# Patient Record
Sex: Male | Born: 1960 | Race: White | Hispanic: No | Smoking: Current every day smoker
Health system: Southern US, Community
[De-identification: ages and names within clinical notes are randomized; demographics above are authoritative.]

## PROBLEM LIST (undated history)

## (undated) DIAGNOSIS — C189 Malignant neoplasm of colon, unspecified: Secondary | ICD-10-CM

## (undated) DIAGNOSIS — F419 Anxiety disorder, unspecified: Secondary | ICD-10-CM

## (undated) DIAGNOSIS — G709 Myoneural disorder, unspecified: Secondary | ICD-10-CM

## (undated) DIAGNOSIS — T7840XA Allergy, unspecified, initial encounter: Secondary | ICD-10-CM

## (undated) DIAGNOSIS — F32A Depression, unspecified: Secondary | ICD-10-CM

## (undated) DIAGNOSIS — F329 Major depressive disorder, single episode, unspecified: Secondary | ICD-10-CM

## (undated) HISTORY — DX: Anxiety disorder, unspecified: F41.9

## (undated) HISTORY — DX: Allergy, unspecified, initial encounter: T78.40XA

## (undated) HISTORY — DX: Major depressive disorder, single episode, unspecified: F32.9

## (undated) HISTORY — DX: Malignant neoplasm of colon, unspecified: C18.9

## (undated) HISTORY — DX: Myoneural disorder, unspecified: G70.9

## (undated) HISTORY — PX: COLON RESECTION: SHX5231

## (undated) HISTORY — DX: Depression, unspecified: F32.A

---

## 2014-10-27 DIAGNOSIS — K6389 Other specified diseases of intestine: Secondary | ICD-10-CM | POA: Insufficient documentation

## 2014-10-27 DIAGNOSIS — L02211 Cutaneous abscess of abdominal wall: Secondary | ICD-10-CM | POA: Insufficient documentation

## 2014-10-27 DIAGNOSIS — A419 Sepsis, unspecified organism: Secondary | ICD-10-CM | POA: Insufficient documentation

## 2014-10-30 DIAGNOSIS — G9341 Metabolic encephalopathy: Secondary | ICD-10-CM | POA: Insufficient documentation

## 2014-11-05 DIAGNOSIS — E43 Unspecified severe protein-calorie malnutrition: Secondary | ICD-10-CM | POA: Insufficient documentation

## 2014-11-12 DIAGNOSIS — F10231 Alcohol dependence with withdrawal delirium: Secondary | ICD-10-CM | POA: Insufficient documentation

## 2014-11-12 DIAGNOSIS — F10931 Alcohol use, unspecified with withdrawal delirium: Secondary | ICD-10-CM | POA: Insufficient documentation

## 2014-12-09 DIAGNOSIS — C182 Malignant neoplasm of ascending colon: Secondary | ICD-10-CM | POA: Insufficient documentation

## 2015-02-27 DIAGNOSIS — R21 Rash and other nonspecific skin eruption: Secondary | ICD-10-CM | POA: Insufficient documentation

## 2015-02-27 DIAGNOSIS — R112 Nausea with vomiting, unspecified: Secondary | ICD-10-CM | POA: Insufficient documentation

## 2015-02-27 DIAGNOSIS — T451X5A Adverse effect of antineoplastic and immunosuppressive drugs, initial encounter: Secondary | ICD-10-CM

## 2015-02-27 DIAGNOSIS — T7840XA Allergy, unspecified, initial encounter: Secondary | ICD-10-CM | POA: Insufficient documentation

## 2015-02-27 DIAGNOSIS — R6889 Other general symptoms and signs: Secondary | ICD-10-CM | POA: Insufficient documentation

## 2015-02-27 DIAGNOSIS — R197 Diarrhea, unspecified: Secondary | ICD-10-CM | POA: Insufficient documentation

## 2015-04-07 DIAGNOSIS — L0211 Cutaneous abscess of neck: Secondary | ICD-10-CM | POA: Insufficient documentation

## 2015-04-07 DIAGNOSIS — W57XXXA Bitten or stung by nonvenomous insect and other nonvenomous arthropods, initial encounter: Secondary | ICD-10-CM | POA: Insufficient documentation

## 2015-04-07 DIAGNOSIS — R21 Rash and other nonspecific skin eruption: Secondary | ICD-10-CM | POA: Insufficient documentation

## 2015-04-07 DIAGNOSIS — E876 Hypokalemia: Secondary | ICD-10-CM | POA: Insufficient documentation

## 2015-04-28 DIAGNOSIS — J9809 Other diseases of bronchus, not elsewhere classified: Secondary | ICD-10-CM | POA: Insufficient documentation

## 2015-04-28 DIAGNOSIS — L309 Dermatitis, unspecified: Secondary | ICD-10-CM | POA: Insufficient documentation

## 2015-04-28 DIAGNOSIS — T7840XA Allergy, unspecified, initial encounter: Secondary | ICD-10-CM | POA: Insufficient documentation

## 2015-07-07 DIAGNOSIS — IMO0002 Reserved for concepts with insufficient information to code with codable children: Secondary | ICD-10-CM | POA: Insufficient documentation

## 2016-05-30 ENCOUNTER — Encounter (HOSPITAL_COMMUNITY): Payer: BLUE CROSS/BLUE SHIELD

## 2016-05-30 ENCOUNTER — Ambulatory Visit (HOSPITAL_COMMUNITY)
Admission: RE | Admit: 2016-05-30 | Discharge: 2016-05-30 | Disposition: A | Discharge status: Home / Self Care | Payer: BLUE CROSS/BLUE SHIELD | Source: Ambulatory Visit | Attending: Oncology | Admitting: Oncology

## 2016-05-30 ENCOUNTER — Encounter (HOSPITAL_COMMUNITY): Payer: Self-pay

## 2016-05-30 ENCOUNTER — Encounter (HOSPITAL_COMMUNITY): Payer: BLUE CROSS/BLUE SHIELD | Attending: Oncology | Admitting: Oncology

## 2016-05-30 VITALS — BP 115/75 | HR 64 | Temp 99.1°F | Resp 18 | Ht 71.75 in | Wt 176.7 lb

## 2016-05-30 DIAGNOSIS — C182 Malignant neoplasm of ascending colon: Secondary | ICD-10-CM

## 2016-05-30 DIAGNOSIS — M7989 Other specified soft tissue disorders: Secondary | ICD-10-CM | POA: Diagnosis present

## 2016-05-30 DIAGNOSIS — R197 Diarrhea, unspecified: Secondary | ICD-10-CM

## 2016-05-30 DIAGNOSIS — L253 Unspecified contact dermatitis due to other chemical products: Secondary | ICD-10-CM

## 2016-05-30 LAB — CBC WITH DIFFERENTIAL/PLATELET
BASOS ABS: 0.1 10*3/uL (ref 0.0–0.1)
Basophils Relative: 1 %
EOS ABS: 0.5 10*3/uL (ref 0.0–0.7)
EOS PCT: 6 %
HCT: 38.3 % — ABNORMAL LOW (ref 39.0–52.0)
Hemoglobin: 12.8 g/dL — ABNORMAL LOW (ref 13.0–17.0)
Lymphocytes Relative: 12 %
Lymphs Abs: 1 10*3/uL (ref 0.7–4.0)
MCH: 37 pg — ABNORMAL HIGH (ref 26.0–34.0)
MCHC: 33.4 g/dL (ref 30.0–36.0)
MCV: 110.7 fL — ABNORMAL HIGH (ref 78.0–100.0)
Monocytes Absolute: 0.9 10*3/uL (ref 0.1–1.0)
Monocytes Relative: 11 %
Neutro Abs: 6 10*3/uL (ref 1.7–7.7)
Neutrophils Relative %: 71 %
PLATELETS: 242 10*3/uL (ref 150–400)
RBC: 3.46 MIL/uL — ABNORMAL LOW (ref 4.22–5.81)
RDW: 13.5 % (ref 11.5–15.5)
WBC: 8.4 10*3/uL (ref 4.0–10.5)

## 2016-05-30 LAB — COMPREHENSIVE METABOLIC PANEL
ALBUMIN: 3.4 g/dL — AB (ref 3.5–5.0)
ALT: 23 U/L (ref 17–63)
AST: 29 U/L (ref 15–41)
Alkaline Phosphatase: 111 U/L (ref 38–126)
Anion gap: 6 (ref 5–15)
BUN: 7 mg/dL (ref 6–20)
CHLORIDE: 100 mmol/L — AB (ref 101–111)
CO2: 28 mmol/L (ref 22–32)
Calcium: 9 mg/dL (ref 8.9–10.3)
Creatinine, Ser: 0.54 mg/dL — ABNORMAL LOW (ref 0.61–1.24)
GFR calc Af Amer: >60 mL/min (ref >60)
GFR calc non Af Amer: >60 mL/min (ref >60)
GLUCOSE: 93 mg/dL (ref 65–99)
POTASSIUM: 4.2 mmol/L (ref 3.5–5.1)
Sodium: 134 mmol/L — ABNORMAL LOW (ref 135–145)
Total Bilirubin: 0.5 mg/dL (ref 0.3–1.2)
Total Protein: 6.9 g/dL (ref 6.5–8.1)

## 2016-05-30 MED ORDER — PREDNISONE 10 MG PO TABS: ORAL_TABLET | ORAL | 0 refills | Status: DC

## 2016-05-30 MED ORDER — DIPHENOXYLATE-ATROPINE 2.5-0.025 MG PO TABS: 1.0000 | ORAL_TABLET | Freq: Four times a day (QID) | ORAL | 0 refills | Status: DC | PRN

## 2016-05-30 NOTE — Progress Notes (Signed)
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   HISTORY OF PRESENTING ILLNESS:  Edward Graham 56 y.o. male is here because of pT4a, N0 right sided colon cancer with a questionable 8.8 cm mass separate from the primary grouped with the omentum diagnosed 10/2014. He initially presented with right sided abdominal pain and complaint that his right flank was becoming progressively erythematous and edematous. Patient was previously being followed by Dr. Vallarie Mare of [facility] in [place].  He presented to the ED on 10/26/2014 with abdominal pain, a CT showed suspicion for right cecal mass with perforation and decompression into the subcutaneous fat of the right lower abdominal wall and of the right pelvic side wall. He underwent a right hemicolectomy and debulking on 11/30/2014 and low grade mucinous adenocarcinoma was found without definitive invasion of neighboring organs. 33 LN were removed and all were negative for malignancy. Radial margins were unable to be evaluated due to fragmented specimen and perforation. There was a separate 8.8 cm tumor of the omentum was found as well, it was unclear whether this was an implant or a fragmented primary. The omentum itself was negative. He had multiple surgical would infections requiring debridement following his hemicolectomy. He was discharged on 11/12/14. Prior to starting adjuvant therapy he had baseline scans performed which were negative for metastatic disease.  He has received adjuvant CAPEOX, with progression seen on scans in the RLQ mass. He has moved to the [place] area and is transferring care. He was last seen at [facility] in [place] on 03/02/2016. He had a biopsy scheduled of the RLQ abdominal mass in march 2018, but did not show up.    He presents with his father, who is taking care of his  medical care. He has not been doing well. He states that since his colectomy, he has a lot of diarrhea, about 15-20 bowel movements a day, without relief for Imodium. 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 He also has chronic vertigo/balance issues. Now he has a new onset rash. His sister washed his clothes in a new detergent and now he has a rash on his whole body. He has a history of eczema and the rash has now plaqued over. He tried hydrocortisone cream, but it did not help. The rash is painful. Since January, he has not lost any weight. He uses an inhaler for allergies. The skin on the bottoms of his feet are cracking and painful. He reports leg swelling and abdominal pain. Denies dehydration, headaches, chest pain, SOB, or any other concerns.   MEDICAL HISTORY:  Past Medical History:  Diagnosis Date  . Allergy   . Anxiety   . Colon cancer ([place])   . Depression   . Neuromuscular disorder [facility])     SURGICAL HISTORY: Past Surgical History:  Procedure Laterality Date  . COLON RESECTION      SOCIAL HISTORY: Social History   Social History  . Marital status: Divorced    Spouse name: N/A  . Number of children: N/A  . Years of education: N/A   Occupational History  . Not on file.   Social History Main Topics  . Smoking status: Current Every Day Smoker    Packs/day: 1.00    Years: 40.00    Types: Cigarettes  . Smokeless tobacco: Never Used  .  Alcohol use Yes     Comment: states he drinks like a fish  . Drug use: Yes    Types: "Crack" cocaine, Marijuana     Comment: quit age 74  . Sexual activity: Not on file     Comment: divorced   Other Topics Concern  . Not on file   Social History Narrative  . No narrative on file    FAMILY HISTORY: Family History  Problem Relation Age of Onset  . Emphysema Mother   . Asthma Sister     ALLERGIES:  has No Known Allergies.  MEDICATIONS:  Current Outpatient Prescriptions  Medication Sig  Dispense Refill  . ALBUTEROL SULFATE HFA IN Inhale into the lungs.    Marland Kitchen levocetirizine (XYZAL) 5 MG tablet Take by mouth.    . Multiple Vitamin (THERA) TABS Take by mouth.    . potassium chloride SA (K-DUR,KLOR-CON) 20 MEQ tablet Take by mouth.     No current facility-administered medications for this visit.    Review of Systems  Constitutional: Negative.  Negative for weight loss.       No dehydration  HENT: Negative.   Eyes: Negative.   Respiratory: Negative.  Negative for shortness of breath.   Cardiovascular: Positive for leg swelling. Negative for chest pain.  Gastrointestinal: Positive for abdominal pain and diarrhea (severe).       Large abdominal hernia   Genitourinary: Negative.   Musculoskeletal: Negative.   Skin: Positive for rash (whole body).       Dry, cracking skin on feet  Neurological: Positive for tingling (hands and feet). Negative for headaches.       Balance problems  Endo/Heme/Allergies: Negative.   Psychiatric/Behavioral: Negative.   All other systems reviewed and are negative. 14 point ROS was done and is otherwise as detailed above or in HPI  PHYSICAL EXAMINATION: ECOG PERFORMANCE STATUS: 3 - Symptomatic, >50% confined to bed  Vitals:   05/30/16 0853  BP: 115/75  Pulse: 64  Resp: 18  Temp: 99.1 F (37.3 C)   Filed Weights   05/30/16 0853  Weight: 176 lb 11.2 oz (80.2 kg)   Physical Exam  Constitutional: He is oriented to person, place, and time and well-developed, well-nourished, and in no distress.  HENT:  Head: Normocephalic and atraumatic.  Mouth/Throat: Oropharynx is clear and moist.  Eyes: Conjunctivae and EOM are normal. Pupils are equal, round, and reactive to light.  Neck: Normal range of motion. Neck supple.  Cardiovascular: Normal rate, regular rhythm and normal heart sounds.   Pulmonary/Chest: Effort normal and breath sounds normal.  Abdominal: Soft. Bowel sounds are normal. There is tenderness (diffuse tenderness).  2 large  abdominal hernias, diffuse TTP, large surgical scars well healed  Musculoskeletal: Normal range of motion.  Right calf TTP, mild right foot swelling  Neurological: He is alert and oriented to person, place, and time. Gait normal.  Skin: Skin is warm and dry.  Diffuse rash with a large confluent rash on his right anterior hip  Nursing note and vitals reviewed.  LABORATORY DATA:  I have reviewed the data as listed No results found for: WBC, HGB, HCT, MCV, PLT CMP  No results found for: NA, K, CL, CO2, GLUCOSE, BUN, CREATININE, CALCIUM, PROT, ALBUMIN, AST, ALT, ALKPHOS, BILITOT, GFRNONAA, GFRAA  RADIOGRAPHIC STUDIES: I have personally reviewed the radiological images as listed and agreed with the findings in the report.  CT Abdomen 03/04/2016 IMPRESSION: 1. Findings highly concerning for disease progression with interval increase in the soft  tissue mass within the right lower quadrant.  2. Apparent thickening of the rectum as can be seen in the setting of proctitis.  3. Hepatic stenosis.   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 Chronic diarrhea following colectomy. Diffuse contact dermatitis due to allergy from detergent Right foot swelling  I have ordered restaging scans with PET/CT and MRI brain. Check CEA level.   Referral to IR for port placement. Patient will likely need more chemotherapy in light of this growing RLQ soft tissue mass   Prescribe lomotil for his chronic diarrhea since he's had no relief from Imodium and his diarrhea is debilitating to the point where he does not leave his home.   Prescribe prednisone 20 mg for 7 days, then 10 mg for 7 days for treatment of his contact dermatitis that is not improving with topical cortisone  treatment.   Order [place] of right leg to check for DVT since he complains of right calf pain and foot swelling.   He will return for follow up in 10-14 days to review scans and discuss next plan of care.  ORDERS PLACED FOR THIS ENCOUNTER: Orders Placed This Encounter  Procedures  . NM PET Image Restag (PS) Skull Base To Thigh    Standing Status:   Future    Standing Expiration Date:   05/30/2017    Order Specific Question:   Reason for Exam (SYMPTOM  OR DIAGNOSIS REQUIRED)    Answer:   restaging scans for patient with colon cancer with RLQ soft tissue mass    Order Specific Question:   If indicated for the ordered procedure, I authorize the administration of a radiopharmaceutical per Radiology protocol    Answer:   Yes    Order Specific Question:   Preferred imaging location?    Answer:   [facility]    Order Specific Question:   Radiology Contrast Protocol - do NOT remove file path    Answer:   \\[email or web address]  . MR Brain W Wo Contrast    Standing Status:   Future    Standing Expiration Date:   05/30/2017    Order Specific Question:   If indicated for the ordered procedure, I authorize the administration of contrast media per Radiology protocol    Answer:   Yes    Order Specific Question:   Reason for Exam (SYMPTOM  OR DIAGNOSIS REQUIRED)    Answer:   vertigo, h/o colon CA rule out brain mets    Order Specific Question:   What is the patient's sedation requirement?    Answer:   No Sedation    Order Specific Question:   Does the patient have a pacemaker or implanted devices?    Answer:   No    Order Specific Question:   Preferred imaging location?    Answer:   [facility] (table limit-350lbs)    Order Specific Question:   Radiology Contrast Protocol - do NOT remove file path    Answer:   \\[email or web address].PDF  . US Venous Img Lower Bilateral    Patient can leave after procedure per provider    Standing Status:    Future    Number of Occurrences:   1    Standing Expiration Date:   05/30/2017    Order Specific  Question:   Reason for Exam (SYMPTOM  OR DIAGNOSIS REQUIRED)    Answer:   right lower foot swelling with calf pain, r/o DVT. hypercoagulable state due to underlying colon CA    Order Specific Question:   Preferred imaging location?    Answer:   [facility]    Order Specific Question:   Call Results- Best Contact Number?    Answer:   [phone]  . IR US Guide Vasc Access Left    Standing Status:   Future    Standing Expiration Date:   07/30/2017    Order Specific Question:   Reason for Exam (SYMPTOM  OR DIAGNOSIS REQUIRED)    Answer:   colon CA needs chemoport placement    Order Specific Question:   Preferred Imaging Location?    Answer:   [facility]  . CBC with Differential    Standing Status:   Future    Number of Occurrences:   1    Standing Expiration Date:   05/30/2017  . Comprehensive metabolic panel    Standing Status:   Future    Number of Occurrences:   1    Standing Expiration Date:   05/30/2017  . CEA    Standing Status:   Future    Number of Occurrences:   1    Standing Expiration Date:   05/30/2017    All questions were answered. The patient knows to call the clinic with any problems, questions or concerns.  This document serves as a record of services personally performed by Twana First, MD. It was created on her behalf by Martinique Casey, a trained medical scribe. The creation of this record is based on the scribe's personal observations and the provider's statements to them. This document has been checked and approved by the attending provider.  I have reviewed the above documentation for accuracy and completeness and I agree with the above.  This note was electronically signed.    Martinique M Casey  05/30/2016 9:11 AM

## 2016-05-30 NOTE — Patient Instructions (Addendum)
[place] at [facility] Discharge Instructions  RECOMMENDATIONS MADE BY THE CONSULTANT AND ANY TEST RESULTS WILL BE SENT TO YOUR REFERRING PHYSICIAN.  You saw Dr. Talbert Cage today. RTC in 10-14 days. Needs doppler venous US to r/o DVT today. Labs today. Stat restaging PET CT and MRI brain Stat IR consult for chemoport placement. Please see Amy or Danielle at check-out for appointments  Thank you for choosing [place] at [facility] to provide your oncology and hematology care.  To afford each patient quality time with our provider, please arrive at least 15 minutes before your scheduled appointment time.    If you have a lab appointment with the [facility] please come in thru the  Main Entrance and check in at the main information desk  You need to re-schedule your appointment should you arrive 10 or more minutes late.  We strive to give you quality time with our providers, and arriving late affects you and other patients whose appointments are after yours.  Also, if you no show three or more times for appointments you may be dismissed from the clinic at the providers discretion.     Again, thank you for choosing [facility].  Our hope is that these requests will decrease the amount of time that you wait before being seen by our physicians.       _____________________________________________________________  Should you have questions after your visit to [facility], please contact our office at [phone] between the hours of 8:30 a.m. and 4:30 p.m.  Voicemails left after 4:30 p.m. will not be returned until the following business day.  For prescription refill requests, have your pharmacy contact our office.       Resources For Cancer Patients and their Caregivers ? American Cancer Society: Can assist with transportation, wigs, general needs, runs Look Good Feel Better.        878-883-8236 ? Cancer  Care: Provides financial assistance, online support groups, medication/co-pay assistance.  1-800-813-HOPE 872-705-4027) ? [facility] Assists [place] cancer patients and their families through emotional , educational and financial support.  (647)634-1572 ? [place] DSS Where to apply for food stamps, Medicaid and utility assistance. (239)797-2336 ? [facility]: Transportation to medical appointments. 216-150-5308 ? Social Security Administration: May apply for disability if have a Stage IV cancer. 5345516152 272-628-5763 ? [employer], Disability and Transit Services: Assists with nutrition, care and transit needs. [place] Support Programs: @10RELATIVEDAYS @ > Cancer Support Group  2nd Tuesday of the month 1pm-2pm, Journey Room  > Creative Journey  3rd Tuesday of the month 1130am-1pm, Journey Room  > Look Good Feel Better  1st Wednesday of the month 10am-12 noon, Journey Room (Call [place] to register [phone])

## 2016-05-31 ENCOUNTER — Other Ambulatory Visit: Payer: Self-pay | Admitting: General Surgery

## 2016-05-31 ENCOUNTER — Ambulatory Visit (HOSPITAL_COMMUNITY)
Admission: RE | Admit: 2016-05-31 | Discharge: 2016-05-31 | Disposition: A | Discharge status: Home / Self Care | Payer: BLUE CROSS/BLUE SHIELD | Source: Ambulatory Visit | Attending: Oncology | Admitting: Oncology

## 2016-05-31 DIAGNOSIS — C182 Malignant neoplasm of ascending colon: Secondary | ICD-10-CM | POA: Diagnosis present

## 2016-05-31 LAB — CEA: CEA: 9.8 ng/mL — ABNORMAL HIGH (ref 0.0–4.7)

## 2016-05-31 MED ORDER — GADOBENATE DIMEGLUMINE 529 MG/ML IV SOLN
16.0000 mL | Freq: Once | INTRAVENOUS | Status: AC | PRN
  Administered 2016-05-31: 16 mL via INTRAVENOUS

## 2016-06-01 ENCOUNTER — Ambulatory Visit (HOSPITAL_COMMUNITY)
Admission: RE | Admit: 2016-06-01 | Discharge: 2016-06-01 | Disposition: A | Discharge status: Home / Self Care | Payer: BLUE CROSS/BLUE SHIELD | Source: Ambulatory Visit | Attending: Oncology | Admitting: Oncology

## 2016-06-01 ENCOUNTER — Other Ambulatory Visit (HOSPITAL_COMMUNITY): Payer: Self-pay | Admitting: Oncology

## 2016-06-01 ENCOUNTER — Encounter (HOSPITAL_COMMUNITY): Payer: Self-pay

## 2016-06-01 DIAGNOSIS — F419 Anxiety disorder, unspecified: Secondary | ICD-10-CM | POA: Insufficient documentation

## 2016-06-01 DIAGNOSIS — Z9049 Acquired absence of other specified parts of digestive tract: Secondary | ICD-10-CM | POA: Insufficient documentation

## 2016-06-01 DIAGNOSIS — C182 Malignant neoplasm of ascending colon: Secondary | ICD-10-CM | POA: Insufficient documentation

## 2016-06-01 DIAGNOSIS — F17210 Nicotine dependence, cigarettes, uncomplicated: Secondary | ICD-10-CM | POA: Insufficient documentation

## 2016-06-01 DIAGNOSIS — F329 Major depressive disorder, single episode, unspecified: Secondary | ICD-10-CM | POA: Diagnosis not present

## 2016-06-01 DIAGNOSIS — R1903 Right lower quadrant abdominal swelling, mass and lump: Secondary | ICD-10-CM | POA: Insufficient documentation

## 2016-06-01 DIAGNOSIS — Z7952 Long term (current) use of systemic steroids: Secondary | ICD-10-CM | POA: Insufficient documentation

## 2016-06-01 DIAGNOSIS — G709 Myoneural disorder, unspecified: Secondary | ICD-10-CM | POA: Insufficient documentation

## 2016-06-01 HISTORY — PX: IR US GUIDE VASC ACCESS RIGHT: IMG2390

## 2016-06-01 HISTORY — PX: IR FLUORO GUIDE PORT INSERTION RIGHT: IMG5741

## 2016-06-01 LAB — CBC WITH DIFFERENTIAL/PLATELET
Basophils Absolute: 0 10*3/uL (ref 0.0–0.1)
Basophils Relative: 1 %
EOS ABS: 0.2 10*3/uL (ref 0.0–0.7)
EOS PCT: 3 %
HCT: 36.7 % — ABNORMAL LOW (ref 39.0–52.0)
Hemoglobin: 12.3 g/dL — ABNORMAL LOW (ref 13.0–17.0)
LYMPHS ABS: 0.8 10*3/uL (ref 0.7–4.0)
LYMPHS PCT: 10 %
MCH: 36.8 pg — AB (ref 26.0–34.0)
MCHC: 33.5 g/dL (ref 30.0–36.0)
MCV: 109.9 fL — AB (ref 78.0–100.0)
MONO ABS: 0.8 10*3/uL (ref 0.1–1.0)
Monocytes Relative: 10 %
Neutro Abs: 5.9 10*3/uL (ref 1.7–7.7)
Neutrophils Relative %: 76 %
PLATELETS: 226 10*3/uL (ref 150–400)
RBC: 3.34 MIL/uL — ABNORMAL LOW (ref 4.22–5.81)
RDW: 13.5 % (ref 11.5–15.5)
WBC: 7.8 10*3/uL (ref 4.0–10.5)

## 2016-06-01 LAB — BASIC METABOLIC PANEL
ANION GAP: 9 (ref 5–15)
BUN: 5 mg/dL — ABNORMAL LOW (ref 6–20)
CALCIUM: 9 mg/dL (ref 8.9–10.3)
CO2: 26 mmol/L (ref 22–32)
CREATININE: 0.56 mg/dL — AB (ref 0.61–1.24)
Chloride: 103 mmol/L (ref 101–111)
GFR calc Af Amer: >60 mL/min (ref >60)
Glucose, Bld: 94 mg/dL (ref 65–99)
Potassium: 3.4 mmol/L — ABNORMAL LOW (ref 3.5–5.1)
Sodium: 138 mmol/L (ref 135–145)

## 2016-06-01 LAB — PROTIME-INR
INR: 0.97
Prothrombin Time: 12.9 seconds (ref 11.4–15.2)

## 2016-06-01 MED ORDER — CEFAZOLIN SODIUM-DEXTROSE 2-4 GM/100ML-% IV SOLN
2.0000 g | INTRAVENOUS | Status: AC
  Administered 2016-06-01: 2 g via INTRAVENOUS

## 2016-06-01 MED ORDER — MIDAZOLAM HCL 2 MG/2ML IJ SOLN
INTRAMUSCULAR | Status: AC | PRN
  Administered 2016-06-01 (×4): 1 mg via INTRAVENOUS

## 2016-06-01 MED ORDER — LIDOCAINE HCL 1 % IJ SOLN
INTRAMUSCULAR | Status: AC | PRN
  Administered 2016-06-01: 10 mL

## 2016-06-01 MED ORDER — FENTANYL CITRATE (PF) 100 MCG/2ML IJ SOLN
INTRAMUSCULAR | Status: AC
  Filled 2016-06-01: qty 4

## 2016-06-01 MED ORDER — LIDOCAINE-EPINEPHRINE (PF) 2 %-1:200000 IJ SOLN
INTRAMUSCULAR | Status: AC
  Filled 2016-06-01: qty 20

## 2016-06-01 MED ORDER — HEPARIN SOD (PORK) LOCK FLUSH 100 UNIT/ML IV SOLN
INTRAVENOUS | Status: AC
  Filled 2016-06-01: qty 5

## 2016-06-01 MED ORDER — FENTANYL CITRATE (PF) 100 MCG/2ML IJ SOLN
INTRAMUSCULAR | Status: AC | PRN
  Administered 2016-06-01 (×2): 50 ug via INTRAVENOUS

## 2016-06-01 MED ORDER — LIDOCAINE HCL 1 % IJ SOLN
INTRAMUSCULAR | Status: AC
  Filled 2016-06-01: qty 20

## 2016-06-01 MED ORDER — FLUMAZENIL 0.5 MG/5ML IV SOLN
INTRAVENOUS | Status: AC
  Filled 2016-06-01: qty 5

## 2016-06-01 MED ORDER — NALOXONE HCL 0.4 MG/ML IJ SOLN
INTRAMUSCULAR | Status: AC
  Filled 2016-06-01: qty 1

## 2016-06-01 MED ORDER — CEFAZOLIN SODIUM-DEXTROSE 2-4 GM/100ML-% IV SOLN
INTRAVENOUS | Status: AC
  Filled 2016-06-01: qty 100

## 2016-06-01 MED ORDER — SODIUM CHLORIDE 0.9 % IV SOLN
INTRAVENOUS | Status: DC
  Administered 2016-06-01: 12:00:00 via INTRAVENOUS

## 2016-06-01 MED ORDER — MIDAZOLAM HCL 2 MG/2ML IJ SOLN
INTRAMUSCULAR | Status: AC
  Filled 2016-06-01: qty 4

## 2016-06-01 MED ORDER — HYDROXYZINE HCL 50 MG/ML IM SOLN
25.0000 mg | Freq: Once | INTRAMUSCULAR | Status: AC
  Administered 2016-06-01: 25 mg via INTRAMUSCULAR
  Filled 2016-06-01: qty 0.5

## 2016-06-01 NOTE — Procedures (Signed)
Interventional Radiology Procedure Note  Procedure: Placement of a right IJ approach single lumen PowerPort.  Tip is positioned at the superior cavoatrial junction and catheter is ready for immediate use.  Complications: No immediate Recommendations:  - Ok to shower tomorrow - Do not submerge for 7 days - Routine line care   Signed,  Carmelina Balducci K. Jerame Hedding, MD   

## 2016-06-01 NOTE — Consult Note (Signed)
Chief Complaint: Patient was seen in consultation today for Port-A-Cath placement  Referring Physician(s): [name],[name]  Supervising Physician: Jacqulynn Cadet  Patient Status: [facility] - Out-pt  History of Present Illness: Edward Graham is a 56 y.o. 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 Request now received for Port-A-Cath placement for chemotherapy.  Past Medical History:  Diagnosis Date  . Allergy   . Anxiety   . Colon cancer ([place])   . Depression   . Neuromuscular disorder [facility])     Past Surgical History:  Procedure Laterality Date  . COLON RESECTION      Allergies: Patient has no known allergies.  Medications: Prior to Admission medications   Medication Sig Start Date End Date Taking? Authorizing Provider  ALBUTEROL SULFATE HFA IN Inhale into the lungs.    Historical Provider, MD  diphenoxylate-atropine (LOMOTIL) 2.5-0.025 MG tablet Take 1 tablet by mouth 4 (four) times daily as needed for diarrhea or loose stools. 05/30/16   Twana First, MD  levocetirizine (XYZAL) 5 MG tablet Take by mouth.    Historical Provider, MD  Multiple Vitamin (THERA) TABS Take by mouth.    Historical Provider, MD  potassium chloride SA (K-DUR,KLOR-CON) 20 MEQ tablet Take by mouth.    Historical Provider, MD  predniSONE (DELTASONE) 10 MG tablet Take 2 tablets daily for one week then 1 tablet daily for one week 05/30/16   Twana First, MD     Family History  Problem Relation Age of Onset  . Emphysema Mother   . Asthma Sister     Social History   Social History  . Marital status: Divorced    Spouse name: N/A  . Number of children: N/A  . Years of education: N/A   Social History Main Topics  . Smoking status: Current Every Day Smoker    Packs/day: 1.00    Years: 40.00    Types:  Cigarettes  . Smokeless tobacco: Never Used  . Alcohol use Yes     Comment: states he drinks like a fish  . Drug use: Yes    Types: "Crack" cocaine, Marijuana     Comment: quit age 55  . Sexual activity: Not on file     Comment: divorced   Other Topics Concern  . Not on file   Social History Narrative  . No narrative on file      Review of Systems currently denies fever, headache, chest pain, dyspnea, cough, abdominal pain, back pain, nausea, vomiting or abnormal bleeding. He does have a diffuse contact dermatitis secondary to allergy from detergent.He is anxious.  Vital Signs: BP 133/86 (BP Location: Right Arm)   Pulse 77   Temp 98.1 F (36.7 C) (Oral)   Resp 18   SpO2 100%   Physical Exam awake, alert. Chest clear to auscultation bilaterally. Heart with regular rate and rhythm. Abdomen soft, two large abdominal hernias noted some mild tenderness to palpation; mild bilateral lower extremity edema  Mallampati Score:     Imaging: Mr Jeri Cos Wo Contrast  Result Date: 05/31/2016 CLINICAL DATA:  56 year old male with weakness and falls. Vertigo. Colon cancer. EXAM: MRI HEAD WITHOUT AND WITH CONTRAST TECHNIQUE: Multiplanar, multiecho pulse sequences of the brain and surrounding structures were obtained without and with intravenous contrast. CONTRAST:  78mL MULTIHANCE GADOBENATE DIMEGLUMINE 529 MG/ML IV SOLN COMPARISON:  None. FINDINGS: Brain: No restricted diffusion to suggest acute infarction. No midline shift, mass effect, evidence of mass lesion, ventriculomegaly, extra-axial collection or acute intracranial hemorrhage. Cervicomedullary junction and pituitary are within normal limits. No abnormal enhancement identified. Small developmental venous anomaly in the left occipital lobe (series 16, image 5, normal variant) Gray and white matter signal is within normal limits throughout the brain. No dural thickening. Vascular: Major intracranial vascular flow voids are preserved. Skull  and upper cervical spine: Visualized bone marrow signal is within normal limits. Negative visualized cervical spine and spinal cord. Sinuses/Orbits: Negative. Other: Visible internal auditory structures appear normal. Negative mastoid air cells. Negative scalp soft tissues. IMPRESSION: No metastatic disease or acute intracranial abnormality. Negative MRI appearance of the brain. Electronically Signed   By: Genevie Ann M.D.   On: 05/31/2016 13:56   US Venous Img Lower Bilateral  Result Date: 05/30/2016 CLINICAL DATA:  Bilateral lower extremity pain and edema EXAM: BILATERAL LOWER EXTREMITY VENOUS DUPLEX ULTRASOUND TECHNIQUE: Gray-scale sonography with graded compression, as well as color Doppler and duplex ultrasound were performed to evaluate the lower extremity deep venous systems from the level of the common femoral vein and including the common femoral, femoral, profunda femoral, popliteal and calf veins including the posterior tibial, peroneal and gastrocnemius veins when visible. The superficial great saphenous vein was also interrogated. Spectral Doppler was utilized to evaluate flow at rest and with distal augmentation maneuvers in the common femoral, femoral and popliteal veins. COMPARISON:  None. FINDINGS: RIGHT LOWER EXTREMITY Common Femoral Vein: No evidence of thrombus. Normal compressibility, respiratory phasicity and response to augmentation. Saphenofemoral Junction: No evidence of thrombus. Normal compressibility and flow on color Doppler imaging. Profunda Femoral Vein: No evidence of thrombus. Normal compressibility and flow on color Doppler imaging. Femoral Vein: No evidence of thrombus. Normal compressibility, respiratory phasicity and response to augmentation. Popliteal Vein: No evidence of thrombus. Normal compressibility, respiratory phasicity and response to augmentation. Calf Veins: No evidence of thrombus. Normal compressibility and flow on color Doppler imaging. Superficial Great Saphenous  Vein: No evidence of thrombus. Normal compressibility and flow on color Doppler imaging. Venous Reflux:  None. Other Findings:  None. LEFT LOWER EXTREMITY Common Femoral Vein: No evidence of thrombus. Normal compressibility, respiratory phasicity and response to augmentation. Saphenofemoral Junction: No evidence of thrombus. Normal compressibility and flow on color Doppler imaging. Profunda Femoral Vein: No evidence of thrombus. Normal compressibility and flow on color Doppler imaging. Femoral Vein: No evidence of thrombus. Normal compressibility, respiratory phasicity and response to augmentation. Popliteal Vein: No evidence of thrombus. Normal compressibility, respiratory phasicity and response to augmentation. Calf Veins: No evidence of thrombus. Normal compressibility and flow on color Doppler imaging. Superficial Great Saphenous Vein: No evidence of thrombus. Normal compressibility and flow on color Doppler imaging. Venous Reflux:  None. Other Findings:  None. IMPRESSION: No evidence of deep venous thrombosis in either lower extremity. Electronically Signed   By: Lowella Grip III M.D.   On: 05/30/2016 11:44    Labs:  CBC:  Recent Labs  05/30/16 1033  WBC 8.4  HGB 12.8*  HCT 38.3*  PLT 242    COAGS: No results for input(s): INR, APTT in the last 8760 hours.  BMP:  Recent Labs  05/30/16 1033  NA 134*  K 4.2  CL 100*  CO2 28  GLUCOSE 93  BUN 7  CALCIUM 9.0  CREATININE 0.54*  GFRNONAA >60  GFRAA >60    LIVER FUNCTION TESTS:  Recent Labs  05/30/16 1033  BILITOT 0.5  AST 29  ALT 23  ALKPHOS 111  PROT 6.9  ALBUMIN 3.4*    TUMOR MARKERS:  Recent Labs  05/30/16 1033  CEA 9.8*    Assessment and Plan: 56 y.o. 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 Request now  received for Port-A-Cath placement for chemotherapy. Risks and benefits discussed with the patient including, but not limited to bleeding, infection, pneumothorax, or fibrin sheath development and need for additional procedures.All of the patient's questions were answered, patient is agreeable to proceed.Consent signed and in chart.Labs pending.      Thank you for this interesting consult.  I greatly enjoyed meeting Edward Graham and look forward to participating in their care.  A copy of this report was sent to the requesting provider on this date.  Electronically Signed: D. Rowe Robert 06/01/2016, 11:49 AM   I spent a total of 20 minutes  in face to face in clinical consultation, greater than 50% of which was counseling/coordinating care for port a cath placement

## 2016-06-01 NOTE — Discharge Instructions (Signed)

## 2016-06-01 NOTE — Progress Notes (Signed)
K. Allred PA, notified of pt. Having pulled off bandaid and part of tegaderm, new dressings applied to site, vistaril effective.Per K. Allred PA,  ok for pt.to take benadryl at home,  Pt. Instructed that he make take benadryl at home,pt. And pt's father verbalized understanding.

## 2016-06-05 ENCOUNTER — Encounter (HOSPITAL_COMMUNITY): Payer: Self-pay | Admitting: Lab

## 2016-06-05 ENCOUNTER — Telehealth (HOSPITAL_COMMUNITY): Payer: Self-pay

## 2016-06-05 NOTE — Telephone Encounter (Signed)
Called and spoke with patients father because I could not reach patient. Then patient called back and I spoke with him also. I explained to both that Dr. Talbert Cage wants patient to see a dermatologist. Message sent to scheduling to make referral. Patient and fater verbalized understanding.

## 2016-06-05 NOTE — Progress Notes (Unsigned)
Referral to Dr Nevada Crane dermatologist.  appt 5/7  @1130 . Patient aware

## 2016-06-05 NOTE — Telephone Encounter (Signed)
Please tell the patient he needs to go see a dermatologist ASAP.

## 2016-06-05 NOTE — Telephone Encounter (Signed)
Patient called stating when he saw Dr. Talbert Cage last week she gave him prednisone for his allergies but it is not strong enough. He states his sister did his laundry and used the wrong detergent and now he is "broken out all over" and "tired of itching". He states his dermatologist used to give him prednisone starting at 80 mg for 1 week then 60, 40, 20, 10. He states this always works and wants to know if Dr. Talbert Cage will prescribe this for him. Explained to patient that I would talk with Dr. Talbert Cage and call him back. [phone]) patient verbalized understanding.

## 2016-06-06 ENCOUNTER — Encounter (HOSPITAL_COMMUNITY): Payer: Self-pay | Admitting: Adult Health

## 2016-06-06 NOTE — Progress Notes (Signed)
Letter of Medical Necessity drafted for PET scan for this patient.  Letter printed and given to Angie, our patient advocate for submission to insurance company.    Mike Craze, NP [place] [phone]

## 2016-06-06 NOTE — Progress Notes (Signed)
    Jun 06, 2016   Re: [place], [place] [address] DOB: 09/02/1960   To Whom It May Concern:   Please allow this letter to serve as medical necessity for PET scan imaging in a patient with known history of T4aN0 adenocarcinoma of ascending colon s/p right hemicolectomy and debulking surgery, now with questionable recurrent/progressive disease with right lower quadrant enlarging mass evident on CT scan.  CEA tumor marker on 05/30/16 is elevated as well.  Symptomatically, patient with severe diarrhea and abdominal pain, which are clinically concerning for recurrent/progressive disease as well. Given the patient's previous history of colon cancer and current clinical symptoms, we are attempting to assess hypermetabolic activity in this new mass to aid in treatment decision making. Also, PET scan is helpful to evaluate for any evidence of metastatic disease, which would drastically change our treatment options for this patient. Timely imaging evaluation is prudent for this patient.     Thank you for your consideration!   Mike Craze, NP [place] [phone]

## 2016-06-07 ENCOUNTER — Telehealth (HOSPITAL_COMMUNITY): Payer: Self-pay | Admitting: Oncology

## 2016-06-07 NOTE — Telephone Encounter (Signed)
PC FROM [employer] STATING THE APPEAL FOR THE DENIED PET SCAN HAS BEEN APPROVED.  CALLED [facility] TO RESCHEDULE APPT THEN CALLED PT

## 2016-06-08 ENCOUNTER — Ambulatory Visit (HOSPITAL_COMMUNITY): Payer: BLUE CROSS/BLUE SHIELD

## 2016-06-08 ENCOUNTER — Ambulatory Visit (HOSPITAL_COMMUNITY)
Admission: RE | Admit: 2016-06-08 | Discharge: 2016-06-08 | Disposition: A | Discharge status: Home / Self Care | Payer: BLUE CROSS/BLUE SHIELD | Source: Ambulatory Visit | Attending: Oncology | Admitting: Oncology

## 2016-06-08 DIAGNOSIS — C182 Malignant neoplasm of ascending colon: Secondary | ICD-10-CM | POA: Diagnosis present

## 2016-06-08 DIAGNOSIS — K760 Fatty (change of) liver, not elsewhere classified: Secondary | ICD-10-CM | POA: Insufficient documentation

## 2016-06-08 DIAGNOSIS — K439 Ventral hernia without obstruction or gangrene: Secondary | ICD-10-CM | POA: Diagnosis not present

## 2016-06-08 DIAGNOSIS — S2242XA Multiple fractures of ribs, left side, initial encounter for closed fracture: Secondary | ICD-10-CM | POA: Diagnosis not present

## 2016-06-08 DIAGNOSIS — X58XXXA Exposure to other specified factors, initial encounter: Secondary | ICD-10-CM | POA: Insufficient documentation

## 2016-06-08 DIAGNOSIS — K4090 Unilateral inguinal hernia, without obstruction or gangrene, not specified as recurrent: Secondary | ICD-10-CM | POA: Diagnosis not present

## 2016-06-08 LAB — GLUCOSE, CAPILLARY: GLUCOSE-CAPILLARY: 82 mg/dL (ref 65–99)

## 2016-06-08 MED ORDER — FLUDEOXYGLUCOSE F - 18 (FDG) INJECTION
8.7600 | Freq: Once | INTRAVENOUS | Status: AC | PRN
  Administered 2016-06-08: 8.76 via INTRAVENOUS

## 2016-06-13 ENCOUNTER — Encounter (HOSPITAL_COMMUNITY): Payer: BLUE CROSS/BLUE SHIELD | Attending: Oncology | Admitting: Oncology

## 2016-06-13 ENCOUNTER — Encounter (HOSPITAL_COMMUNITY): Payer: Self-pay

## 2016-06-13 VITALS — BP 117/79 | HR 78 | Temp 97.7°F | Resp 18 | Wt 186.1 lb

## 2016-06-13 DIAGNOSIS — C182 Malignant neoplasm of ascending colon: Secondary | ICD-10-CM | POA: Diagnosis not present

## 2016-06-13 DIAGNOSIS — G629 Polyneuropathy, unspecified: Secondary | ICD-10-CM

## 2016-06-13 DIAGNOSIS — R21 Rash and other nonspecific skin eruption: Secondary | ICD-10-CM

## 2016-06-13 DIAGNOSIS — Z720 Tobacco use: Secondary | ICD-10-CM

## 2016-06-13 DIAGNOSIS — R1900 Intra-abdominal and pelvic swelling, mass and lump, unspecified site: Secondary | ICD-10-CM

## 2016-06-13 DIAGNOSIS — M7989 Other specified soft tissue disorders: Secondary | ICD-10-CM

## 2016-06-13 DIAGNOSIS — R1030 Lower abdominal pain, unspecified: Secondary | ICD-10-CM

## 2016-06-13 MED ORDER — ALBUTEROL SULFATE HFA 108 (90 BASE) MCG/ACT IN AERS: 2.0000 | INHALATION_SPRAY | Freq: Four times a day (QID) | RESPIRATORY_TRACT | 2 refills | Status: DC | PRN

## 2016-06-13 MED ORDER — LIDOCAINE-PRILOCAINE 2.5-2.5 % EX CREA: 1.0000 "application " | TOPICAL_CREAM | CUTANEOUS | 1 refills | Status: DC | PRN

## 2016-06-13 NOTE — Patient Instructions (Addendum)
[place] at [facility] Discharge Instructions  RECOMMENDATIONS MADE BY THE CONSULTANT AND ANY TEST RESULTS WILL BE SENT TO YOUR REFERRING PHYSICIAN.  You were seen today by Dr. Twana First Follow up in 2 weeks  We will get a biopsy schedule as soon as possible of your abdominal mass   Thank you for choosing [place] at [facility] to provide your oncology and hematology care.  To afford each patient quality time with our provider, please arrive at least 15 minutes before your scheduled appointment time.    If you have a lab appointment with the [facility] please come in thru the  Main Entrance and check in at the main information desk  You need to re-schedule your appointment should you arrive 10 or more minutes late.  We strive to give you quality time with our providers, and arriving late affects you and other patients whose appointments are after yours.  Also, if you no show three or more times for appointments you may be dismissed from the clinic at the providers discretion.     Again, thank you for choosing [facility].  Our hope is that these requests will decrease the amount of time that you wait before being seen by our physicians.       _____________________________________________________________  Should you have questions after your visit to [facility], please contact our office at [phone] between the hours of 8:30 a.m. and 4:30 p.m.  Voicemails left after 4:30 p.m. will not be returned until the following business day.  For prescription refill requests, have your pharmacy contact our office.       Resources For Cancer Patients and their Caregivers ? American Cancer Society: Can assist with transportation, wigs, general needs, runs Look Good Feel Better.        434-312-2563 ? Cancer Care: Provides financial assistance, online support groups, medication/co-pay assistance.  1-800-813-HOPE  (605) 748-7632) ? [facility] Assists [place] cancer patients and their families through emotional , educational and financial support.  919 864 1837 ? [place] DSS Where to apply for food stamps, Medicaid and utility assistance. 769-415-0089 ? [employer]: Transportation to medical appointments. 386-250-6342 ? Social Security Administration: May apply for disability if have a Stage IV cancer. (740) 071-9294 407-066-2156 ? [employer], Disability and Transit Services: Assists with nutrition, care and transit needs. [place] Support Programs: @10RELATIVEDAYS @ > Cancer Support Group  2nd Tuesday of the month 1pm-2pm, Journey Room  > Creative Journey  3rd Tuesday of the month 1130am-1pm, Journey Room  > Look Good Feel Better  1st Wednesday of the month 10am-12 noon, Journey Room (Call [place] to register [phone])

## 2016-06-13 NOTE — Progress Notes (Signed)
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 Negative MRI appearance of the brain.      06/08/2016 PET scan    IMPRESSION: 1. 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 2. Difficult to exclude additional small foci of peritoneal tumor in the pelvis. Small asymmetric right inguinal and external iliac nodes are mildly hypermetabolic, nonspecific. Correlation with prior imaging results recommended. 3. No evidence of hepatic or thoracic metastatic disease. 4. Subacute rib fractures on the left with associated metabolic activity. 5. Mild esophageal activity, possibly esophagitis. 6. Ventral and left inguinal hernias containing colon. Hepatic steatosis.       HISTORY OF PRESENTING ILLNESS:  Edward Graham 56 y.o. male is here because of pT4a, N0 right sided colon cancer with a questionable 8.8 cm mass separate from the primary grouped with the omentum diagnosed 10/2014. He initially presented with right sided abdominal pain and complaint that his right flank was becoming progressively erythematous and edematous. Patient was previously being followed by Dr. Vallarie Mare of [facility] in [place].  He presented to the ED on 10/26/2014 with abdominal pain, a CT showed suspicion for right cecal mass with perforation and decompression into the subcutaneous fat of the right lower abdominal wall and of the right pelvic side wall. He underwent a right hemicolectomy and  debulking on 11/30/2014 and low grade mucinous adenocarcinoma was found without definitive invasion of neighboring organs. 33 LN were removed and all were negative for malignancy. Radial margins were unable to be evaluated due to fragmented specimen and perforation. There was a separate 8.8 cm tumor of the omentum was found as well, it was unclear whether this was an implant or a fragmented primary. The omentum itself was negative. He had multiple surgical would infections requiring debridement following his hemicolectomy. He was discharged on 11/12/14. Prior to starting adjuvant therapy he had baseline scans performed which were negative for metastatic disease.  He has received adjuvant CAPEOX, with progression seen on scans in the RLQ mass. He has moved to the [place] area and is transferring care. He was last seen at [facility] in [place] on 03/02/2016. He had a biopsy scheduled of the RLQ abdominal mass in march 2018, but did not show up.    He presents with his father, who is taking care of his medical care. He has not been doing well. He states that since his colectomy, he has a lot of diarrhea, about 15-20 bowel movements a day, without relief for Imodium. 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 He also has chronic vertigo/balance issues. Now he has a new onset rash. His sister washed his clothes in a new detergent and now he has a rash on his whole body. He has a history of eczema and the rash has now plaqued over. He tried hydrocortisone cream, but it did not help. The rash is painful. Since January, he has not lost any weight. He uses an inhaler for allergies. The skin on the bottoms of his  feet are cracking and painful. He reports leg swelling and abdominal pain. Denies dehydration, headaches, chest pain, SOB, or any other concerns.   INTERVAL HISTORY: Keaston Pile presents to the clinic today for follow up. He is accompanied by his father today.  The patient  reports significant neuropathy to his hands and feet and has difficulty buttoning buttons and writing.   He has seen the dermatologist for his suspected contact dermatitis. He reports his prior rash is improving with higher doses of prednisone and several creams from his dermatologist.   He reports his prior diarrhea has subsided. The patient reports some minor discomfort to his lower abdomen where he believes the pelvic mass is. He reports a good appetite. He reports his sleep "is weird," and he naps in the afternoon as needed.  The patient states "I just want to live," and is interested in any options to treat his colon cancer, even if it makes his neuropathy worse.   He also complains of left inguinal pain and believes that he may need to get his left inguinal hernia repaired.  Since his last visit his right foot swelling has improved and the doppler venous US of right leg performed on his last visit was negative for DVT.  The patient does not have a PCP in the area yet.   MEDICAL HISTORY:  Past Medical History:  Diagnosis Date  . Allergy   . Anxiety   . Colon cancer ([place])   . Depression   . Neuromuscular disorder [facility])     SURGICAL HISTORY: Past Surgical History:  Procedure Laterality Date  . COLON RESECTION    . IR FLUORO GUIDE PORT INSERTION RIGHT  06/01/2016  . IR US GUIDE VASC ACCESS RIGHT  06/01/2016    SOCIAL HISTORY: Social History   Social History  . Marital status: Divorced    Spouse name: N/A  . Number of children: N/A  . Years of education: N/A   Occupational History  . Not on file.   Social History Main Topics  . Smoking status: Current Every Day Smoker    Packs/day: 1.00    Years: 40.00    Types: Cigarettes  . Smokeless tobacco: Never Used  . Alcohol use Yes     Comment: states he drinks like a fish  . Drug use: Yes    Types: "Crack" cocaine, Marijuana     Comment: quit age 81  . Sexual activity: Not on file     Comment: divorced   Other  Topics Concern  . Not on file   Social History Narrative  . No narrative on file    FAMILY HISTORY: Family History  Problem Relation Age of Onset  . Emphysema Mother   . Asthma Sister     ALLERGIES:  is allergic to dust mite extract; mold extract [trichophyton]; and tree extract.  MEDICATIONS:  Current Outpatient Prescriptions  Medication Sig Dispense Refill  . albuterol (PROVENTIL HFA;VENTOLIN HFA) 108 (90 Base) MCG/ACT inhaler Inhale 2 puffs into the lungs every 6 (six) hours as needed for wheezing or shortness of breath. 1 Inhaler 2  . diphenoxylate-atropine (LOMOTIL) 2.5-0.025 MG tablet Take 1 tablet by mouth 4 (four) times daily as needed for diarrhea or loose stools. 60 tablet 0  . levocetirizine (XYZAL) 5 MG tablet Take by mouth.    . Multiple Vitamin (THERA) TABS Take by mouth.    . potassium chloride SA (K-DUR,KLOR-CON) 20 MEQ tablet Take 400 mEq by mouth daily.     Marland Kitchen  predniSONE (DELTASONE) 10 MG tablet Take 2 tablets daily for one week then 1 tablet daily for one week 24 tablet 0   No current facility-administered medications for this visit.    Review of Systems  Constitutional: Negative.  Negative for weight loss.       Good appetite Abnormal sleep schedule with afternoon naps  HENT: Negative.   Eyes: Negative.   Respiratory: Negative.  Negative for shortness of breath.   Cardiovascular: Positive for leg swelling. Negative for chest pain.  Gastrointestinal: Positive for abdominal pain (MIld). Negative for diarrhea (significantly improved).       Large abdominal hernia   Genitourinary: Negative.   Musculoskeletal: Negative.   Skin: Positive for rash (whole body, improving).       Dry, cracking skin on feet  Neurological: Positive for tingling (hands and feet, severe). Negative for headaches.       Balance problems  Endo/Heme/Allergies: Negative.   Psychiatric/Behavioral: Negative.   All other systems reviewed and are negative. 14 point ROS was done and is  otherwise as detailed above or in HPI  PHYSICAL EXAMINATION: ECOG PERFORMANCE STATUS: 1 - Symptomatic but completely ambulatory  Vitals:   06/13/16 1510  BP: 117/79  Pulse: 78  Resp: 18  Temp: 97.7 F (36.5 C)   Filed Weights   06/13/16 1510  Weight: 186 lb 1.6 oz (84.4 kg)   Physical Exam  Constitutional: He is oriented to person, place, and time and well-developed, well-nourished, and in no distress.  HENT:  Head: Normocephalic and atraumatic.  Mouth/Throat: Oropharynx is clear and moist.  Eyes: Conjunctivae and EOM are normal. Pupils are equal, round, and reactive to light.  Neck: Normal range of motion. Neck supple.  Cardiovascular: Normal rate, regular rhythm and normal heart sounds.  Exam reveals no gallop and no friction rub.   No murmur heard. Pulmonary/Chest: Effort normal and breath sounds normal. No respiratory distress. He has no wheezes. He has no rales. He exhibits no tenderness.  Abdominal: Soft. Bowel sounds are normal. He exhibits no distension. There is no tenderness. There is no rebound and no guarding.  2 large abdominal hernias, nontender, large surgical scars well healed  Musculoskeletal: Normal range of motion. He exhibits no edema.  Neurological: He is alert and oriented to person, place, and time. Gait normal.  Skin: Skin is warm and dry. No rash noted. No erythema.  Rash demonstrates improvement, mild rash now with areas of skin scabbing over from excoriation.  Nursing note and vitals reviewed.  LABORATORY DATA:  I have reviewed the data as listed Lab Results  Component Value Date   WBC 7.8 06/01/2016   HGB 12.3 (L) 06/01/2016   HCT 36.7 (L) 06/01/2016   MCV 109.9 (H) 06/01/2016   PLT 226 06/01/2016   CMP     Component Value Date/Time   NA 138 06/01/2016 1153   K 3.4 (L) 06/01/2016 1153   CL 103 06/01/2016 1153   CO2 26 06/01/2016 1153   GLUCOSE 94 06/01/2016 1153   BUN 5 (L) 06/01/2016 1153   CREATININE 0.56 (L) 06/01/2016 1153    CALCIUM 9.0 06/01/2016 1153   PROT 6.9 05/30/2016 1033   ALBUMIN 3.4 (L) 05/30/2016 1033   AST 29 05/30/2016 1033   ALT 23 05/30/2016 1033   ALKPHOS 111 05/30/2016 1033   BILITOT 0.5 05/30/2016 1033   GFRNONAA >60 06/01/2016 1153   GFRAA >60 06/01/2016 1153    RADIOGRAPHIC STUDIES: I have personally reviewed the radiological images as listed  and agreed with the findings in the report.  CT Abdomen 03/04/2016 IMPRESSION: 1. Findings highly concerning for disease progression with interval increase in the soft tissue mass within the right lower quadrant.  2. Apparent thickening of the rectum as can be seen in the setting of proctitis.  3. Hepatic stenosis.   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 Chronic diarrhea following colectomy. Diffuse contact dermatitis due to allergy from detergent Right foot swelling  PLAN: - Reviewed PET scan and MRI brain results in detail with the patient today. I have discussed biopsy of the RLQ abdominal mass and he is agreeable. I will arrange for a stat CT guided biopsy by IR. Send for kras, braf V600E mutation, dMMR/MSI testing. 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 - Patient was encouraged to establish care with a PCP in the area. - Refill Albuterol and EMLA cream for his port today.  - RTC in 2 weeks for follow up.   ORDERS PLACED FOR THIS ENCOUNTER: Orders Placed This Encounter  Procedures  . CT Biopsy    Standing Status:   Future    Standing Expiration Date:   06/13/2017    Order Specific Question:   Lab orders requested (DO NOT place separate lab orders, these will be automatically ordered during procedure  specimen collection):    Answer:   Surgical Pathology    Comments:   send for KRAS mutation on specimen    Order Specific Question:   Reason for Exam (SYMPTOM  OR DIAGNOSIS REQUIRED)    Answer:   patient with h/o colon cancer, now with RLQ abdominal mass, please biopsy    Order Specific Question:   Preferred imaging location?    Answer:   [facility]    Order Specific Question:   Radiology Contrast Protocol - do NOT remove file path    Answer:   [email or web address]    All questions were answered. The patient knows to call the clinic with any problems, questions or concerns.  This document serves as a record of services personally performed by Twana First, MD. It was created on her behalf by Maryla Morrow, a trained medical scribe. The creation of this record is based on the scribe's personal observations and the provider's statements to them. This document has been checked and approved by the attending provider.  I have reviewed the above documentation for accuracy and completeness and I agree with the above.  This note was electronically signed by:   Twana First, MD 06/13/2016 3:21 PM

## 2016-06-20 ENCOUNTER — Other Ambulatory Visit: Payer: Self-pay | Admitting: Radiology

## 2016-06-21 ENCOUNTER — Ambulatory Visit (HOSPITAL_COMMUNITY)
Admission: RE | Admit: 2016-06-21 | Discharge: 2016-06-21 | Disposition: A | Discharge status: Home / Self Care | Payer: BLUE CROSS/BLUE SHIELD | Source: Ambulatory Visit | Attending: Oncology | Admitting: Oncology

## 2016-06-21 ENCOUNTER — Encounter (HOSPITAL_COMMUNITY): Payer: Self-pay

## 2016-06-21 DIAGNOSIS — F329 Major depressive disorder, single episode, unspecified: Secondary | ICD-10-CM | POA: Insufficient documentation

## 2016-06-21 DIAGNOSIS — F419 Anxiety disorder, unspecified: Secondary | ICD-10-CM | POA: Insufficient documentation

## 2016-06-21 DIAGNOSIS — R1903 Right lower quadrant abdominal swelling, mass and lump: Secondary | ICD-10-CM | POA: Diagnosis present

## 2016-06-21 DIAGNOSIS — G709 Myoneural disorder, unspecified: Secondary | ICD-10-CM | POA: Insufficient documentation

## 2016-06-21 DIAGNOSIS — C182 Malignant neoplasm of ascending colon: Secondary | ICD-10-CM

## 2016-06-21 DIAGNOSIS — C786 Secondary malignant neoplasm of retroperitoneum and peritoneum: Secondary | ICD-10-CM | POA: Insufficient documentation

## 2016-06-21 DIAGNOSIS — Z9049 Acquired absence of other specified parts of digestive tract: Secondary | ICD-10-CM | POA: Insufficient documentation

## 2016-06-21 DIAGNOSIS — F17210 Nicotine dependence, cigarettes, uncomplicated: Secondary | ICD-10-CM | POA: Diagnosis not present

## 2016-06-21 LAB — PROTIME-INR
INR: 1.03
PROTHROMBIN TIME: 13.5 s (ref 11.4–15.2)

## 2016-06-21 LAB — CBC
HCT: 41 % (ref 39.0–52.0)
HEMOGLOBIN: 14.2 g/dL (ref 13.0–17.0)
MCH: 35.6 pg — AB (ref 26.0–34.0)
MCHC: 34.6 g/dL (ref 30.0–36.0)
MCV: 102.8 fL — AB (ref 78.0–100.0)
Platelets: 195 10*3/uL (ref 150–400)
RBC: 3.99 MIL/uL — AB (ref 4.22–5.81)
RDW: 13.2 % (ref 11.5–15.5)
WBC: 8.6 10*3/uL (ref 4.0–10.5)

## 2016-06-21 LAB — APTT: aPTT: 30 seconds (ref 24–36)

## 2016-06-21 MED ORDER — SODIUM CHLORIDE 0.9 % IV SOLN
INTRAVENOUS | Status: AC | PRN
  Administered 2016-06-21: 75 mL/h via INTRAVENOUS

## 2016-06-21 MED ORDER — FENTANYL CITRATE (PF) 100 MCG/2ML IJ SOLN
INTRAMUSCULAR | Status: AC
  Filled 2016-06-21: qty 2

## 2016-06-21 MED ORDER — MIDAZOLAM HCL 2 MG/2ML IJ SOLN
INTRAMUSCULAR | Status: AC | PRN
  Administered 2016-06-21: 2 mg via INTRAVENOUS

## 2016-06-21 MED ORDER — DIPHENHYDRAMINE HCL 50 MG/ML IJ SOLN
INTRAMUSCULAR | Status: AC | PRN
  Administered 2016-06-21: 25 mg via INTRAVENOUS

## 2016-06-21 MED ORDER — SODIUM CHLORIDE 0.9 % IV SOLN: INTRAVENOUS | Status: DC

## 2016-06-21 MED ORDER — DIPHENHYDRAMINE HCL 50 MG/ML IJ SOLN
INTRAMUSCULAR | Status: AC
  Filled 2016-06-21: qty 1

## 2016-06-21 MED ORDER — MIDAZOLAM HCL 2 MG/2ML IJ SOLN
INTRAMUSCULAR | Status: AC
  Filled 2016-06-21: qty 2

## 2016-06-21 MED ORDER — FENTANYL CITRATE (PF) 100 MCG/2ML IJ SOLN
INTRAMUSCULAR | Status: AC | PRN
  Administered 2016-06-21 (×2): 50 ug via INTRAVENOUS

## 2016-06-21 NOTE — Sedation Documentation (Signed)
Patient is resting comfortably. 

## 2016-06-21 NOTE — H&P (Signed)
Chief Complaint: Patient was seen in consultation today for abdominal mass biopsy at the request of Graham,Edward  Referring Physician(s): Graham,Edward  Supervising Physician: Aletta Edouard  Patient Status: [facility] - Out-pt  History of Present Illness: Edward Graham is a 56 y.o. male  Hx Colon Ca 12/2014 Surgery - right hemicolectomy- and treatment in [place], [place] now with Dr Talbert Cage. Known omental mass - now recurrent/enlarging mass in RLQ  PET 06/08/16: IMPRESSION: 1. 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 2. Difficult to exclude additional small foci of peritoneal tumor in the pelvis. Small asymmetric right inguinal and external iliac nodes are mildly hypermetabolic, nonspecific. Correlation with prior imaging results recommended. 3. No evidence of hepatic or thoracic metastatic disease. 4. Subacute rib fractures on the left with associated metabolic activity. 5. Mild esophageal activity, possibly esophagitis. 6. Ventral and left inguinal hernias containing colon. Hepatic Steatosis.  Request for RLQ mass biopsy per Dr Talbert Cage Scheduled for procedure today  Past Medical History:  Diagnosis Date  . Allergy   . Anxiety   . Colon cancer ([place])   . Depression   . Neuromuscular disorder [facility])     Past Surgical History:  Procedure Laterality Date  . COLON RESECTION    . IR FLUORO GUIDE PORT INSERTION RIGHT  06/01/2016  . IR US GUIDE VASC ACCESS RIGHT  06/01/2016    Allergies: Dust mite extract; Mold extract [trichophyton]; and Tree extract  Medications: Prior to Admission medications   Medication Sig Start Date End Date Taking? Authorizing Provider  albuterol (PROVENTIL HFA;VENTOLIN HFA) 108 (90 Base) MCG/ACT inhaler Inhale 2 puffs into the lungs every 6 (six) hours as needed for wheezing or shortness of breath. 06/13/16  Yes Twana First, MD  levocetirizine  (XYZAL) 5 MG tablet Take 5 mg by mouth every evening.    Yes [provider]  lidocaine-prilocaine (EMLA) cream Apply 1 application topically as needed. 06/13/16  Yes Twana First, MD  Multiple Vitamin (THERA) TABS Take 1 tablet by mouth daily.    Yes [provider]  Potassium 99 MG TABS Take 400 mg by mouth daily.   Yes [provider]     Family History  Problem Relation Age of Onset  . Emphysema Mother   . Asthma Sister     Social History   Social History  . Marital status: Divorced    Spouse name: N/A  . Number of children: N/A  . Years of education: N/A   Social History Main Topics  . Smoking status: Current Every Day Smoker    Packs/day: 1.00    Years: 40.00    Types: Cigarettes  . Smokeless tobacco: Never Used  . Alcohol use Yes     Comment: states he drinks like a fish  . Drug use: Yes    Types: "Crack" cocaine, Marijuana     Comment: quit age 58  . Sexual activity: Not Asked     Comment: divorced   Other Topics Concern  . None   Social History Narrative  . None    Review of Systems: A 12 point ROS discussed and pertinent positives are indicated in the HPI above.  All other systems are negative.  Review of Systems  Constitutional: Positive for activity change, appetite change and fatigue. Negative for fever.  Respiratory: Positive for cough. Negative for shortness of breath.   Cardiovascular: Negative for chest pain.  Gastrointestinal: Positive for abdominal distention and  abdominal pain.  Musculoskeletal: Negative for back pain and gait problem.  Neurological: Negative for weakness.  Psychiatric/Behavioral: Negative for behavioral problems and confusion.    Vital Signs: BP 132/86 (BP Location: Right Arm)   Pulse 70   Temp 98.4 F (36.9 C) (Oral)   Ht 5\' 11"  (1.803 m)   Wt 186 lb (84.4 kg)   SpO2 98%   BMI 25.94 kg/m   Physical Exam  Constitutional: He is oriented to person, place, and time.  Cardiovascular: Normal  rate, regular rhythm and normal heart sounds.   Pulmonary/Chest: Effort normal. He has rales.  Abdominal: Soft. Bowel sounds are normal. He exhibits distension and mass. There is tenderness.  Musculoskeletal: Normal range of motion.  Neurological: He is alert and oriented to person, place, and time.  Skin: Skin is warm and dry.  Psychiatric: He has a normal mood and affect. His behavior is normal. Judgment and thought content normal.  Nursing note and vitals reviewed.   Mallampati Score:  MD Evaluation Airway: WNL Heart: WNL Abdomen: WNL Chest/ Lungs: WNL ASA  Classification: 3 Mallampati/Airway Score: One  Imaging: Mr Jeri Cos EV Contrast  Result Date: 05/31/2016 CLINICAL DATA:  56 year old male with weakness and falls. Vertigo. Colon cancer. EXAM: MRI HEAD WITHOUT AND WITH CONTRAST TECHNIQUE: Multiplanar, multiecho pulse sequences of the brain and surrounding structures were obtained without and with intravenous contrast. CONTRAST:  37mL MULTIHANCE GADOBENATE DIMEGLUMINE 529 MG/ML IV SOLN COMPARISON:  None. FINDINGS: Brain: No restricted diffusion to suggest acute infarction. No midline shift, mass effect, evidence of mass lesion, ventriculomegaly, extra-axial collection or acute intracranial hemorrhage. Cervicomedullary junction and pituitary are within normal limits. No abnormal enhancement identified. Small developmental venous anomaly in the left occipital lobe (series 16, image 5, normal variant) Gray and white matter signal is within normal limits throughout the brain. No dural thickening. Vascular: Major intracranial vascular flow voids are preserved. Skull and upper cervical spine: Visualized bone marrow signal is within normal limits. Negative visualized cervical spine and spinal cord. Sinuses/Orbits: Negative. Other: Visible internal auditory structures appear normal. Negative mastoid air cells. Negative scalp soft tissues. IMPRESSION: No metastatic disease or acute intracranial  abnormality. Negative MRI appearance of the brain. Electronically Signed   By: Genevie Ann M.D.   On: 05/31/2016 13:56   Nm Pet Image Restag (ps) Skull Base To Thigh  Result Date: 06/08/2016 CLINICAL DATA:  Subsequent treatment strategy for ascending colon cancer diagnosed in September 2016. 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 Adjacent 8.8 cm omental mass. Recurrent right lower quadrant mass on outside imaging. EXAM: NUCLEAR MEDICINE PET SKULL BASE TO THIGH TECHNIQUE: 8.76 mCi F-18 FDG was injected intravenously. Full-ring PET imaging was performed from the skull base to thigh after the radiotracer. CT data was obtained and used for attenuation correction and anatomic localization. FASTING BLOOD GLUCOSE:  Value: 82 mg/dl COMPARISON:  None. FINDINGS: NECK No hypermetabolic cervical lymph nodes are identified.There are no lesions of the pharyngeal mucosal space. Activity in the lymphoid tissue of Waldeyer's ring is nearly symmetric and within physiologic limits. CHEST There are no hypermetabolic mediastinal, hilar or axillary lymph nodes. Mild esophageal activity may indicate esophagitis. There is no suspicious pulmonary activity. The lungs are clear. Right IJ Port-A-Cath extends to the superior cavoatrial junction. There are left-sided rib fractures with associated hypermetabolic activity, further described below. ABDOMEN/PELVIS There is no hypermetabolic activity within the liver, adrenal glands, spleen or pancreas. There is a large peripherally hypermetabolic mass in the right  false pelvis, measuring 6.4 x 5.1 cm on image 157. This demonstrates peripheral hypermetabolic activity, with an SUV max of up to 7.4 posteriorly and 5.8 inferiorly. Additional areas of focal hypermetabolic peritoneal activity are present within the pelvis without associated discrete peritoneal nodularity. This activity is most prominent in the right perirectal space (SUV max 7.9). There is  also focal hypermetabolic activity near the junction of the descending and sigmoid colon (SUV max 5.2). There are several small asymmetric right external iliac and right inguinal lymph nodes. A 9 mm right external iliac node on image 178 is mildly hypermetabolic (SUV max 3.3). Hepatic steatosis noted. There is a ventral abdominal wall hernia containing transverse colon. No evidence of bowel obstruction. Patient also has a left inguinal hernia containing sigmoid colon. SKELETON There is no hypermetabolic activity to suggest osseous metastatic disease. There are fractures of the left fifth and sixth ribs laterally with associated hypermetabolic activity. These demonstrate no pathologic features. Old left-sided rib fractures are also present. IMPRESSION: 1. 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 2. Difficult to exclude additional small foci of peritoneal tumor in the pelvis. Small asymmetric right inguinal and external iliac nodes are mildly hypermetabolic, nonspecific. Correlation with prior imaging results recommended. 3. No evidence of hepatic or thoracic metastatic disease. 4. Subacute rib fractures on the left with associated metabolic activity. 5. Mild esophageal activity, possibly esophagitis. 6. Ventral and left inguinal hernias containing colon. Hepatic steatosis. Electronically Signed   By: Richardean Sale M.D.   On: 06/08/2016 12:02   US Venous Img Lower Bilateral  Result Date: 05/30/2016 CLINICAL DATA:  Bilateral lower extremity pain and edema EXAM: BILATERAL LOWER EXTREMITY VENOUS DUPLEX ULTRASOUND TECHNIQUE: Gray-scale sonography with graded compression, as well as color Doppler and duplex ultrasound were performed to evaluate the lower extremity deep venous systems from the level of the common femoral vein and including the common femoral, femoral, profunda femoral, popliteal and calf veins  including the posterior tibial, peroneal and gastrocnemius veins when visible. The superficial great saphenous vein was also interrogated. Spectral Doppler was utilized to evaluate flow at rest and with distal augmentation maneuvers in the common femoral, femoral and popliteal veins. COMPARISON:  None. FINDINGS: RIGHT LOWER EXTREMITY Common Femoral Vein: No evidence of thrombus. Normal compressibility, respiratory phasicity and response to augmentation. Saphenofemoral Junction: No evidence of thrombus. Normal compressibility and flow on color Doppler imaging. Profunda Femoral Vein: No evidence of thrombus. Normal compressibility and flow on color Doppler imaging. Femoral Vein: No evidence of thrombus. Normal compressibility, respiratory phasicity and response to augmentation. Popliteal Vein: No evidence of thrombus. Normal compressibility, respiratory phasicity and response to augmentation. Calf Veins: No evidence of thrombus. Normal compressibility and flow on color Doppler imaging. Superficial Great Saphenous Vein: No evidence of thrombus. Normal compressibility and flow on color Doppler imaging. Venous Reflux:  None. Other Findings:  None. LEFT LOWER EXTREMITY Common Femoral Vein: No evidence of thrombus. Normal compressibility, respiratory phasicity and response to augmentation. Saphenofemoral Junction: No evidence of thrombus. Normal compressibility and flow on color Doppler imaging. Profunda Femoral Vein: No evidence of thrombus. Normal compressibility and flow on color Doppler imaging. Femoral Vein: No evidence of thrombus. Normal compressibility, respiratory phasicity and response to augmentation. Popliteal Vein: No evidence of thrombus. Normal compressibility, respiratory phasicity and response to augmentation. Calf Veins: No evidence of thrombus. Normal compressibility and flow on color Doppler imaging. Superficial Great Saphenous Vein: No evidence of thrombus. Normal compressibility and  flow on color  Doppler imaging. Venous Reflux:  None. Other Findings:  None. IMPRESSION: No evidence of deep venous thrombosis in either lower extremity. Electronically Signed   By: Lowella Grip III M.D.   On: 05/30/2016 11:44   Ir US Guide Vasc Access Right  Result Date: 06/01/2016 INDICATION: 56 year old male with ascending colon cancer in need of port catheter for chemotherapy. EXAM: IMPLANTED PORT A CATH PLACEMENT WITH ULTRASOUND AND FLUOROSCOPIC GUIDANCE MEDICATIONS: 2 g Ancef; The antibiotic was administered within an appropriate time interval prior to skin puncture. ANESTHESIA/SEDATION: Versed 4 mg IV; Fentanyl 100 mcg IV; Moderate Sedation Time:  26 minutes The patient was continuously monitored during the procedure by the interventional radiology nurse under my direct supervision. FLUOROSCOPY TIME:  0 minutes, 18 seconds (2 mGy) COMPLICATIONS: None immediate. PROCEDURE: The right neck and chest was prepped with chlorhexidine, and draped in the usual sterile fashion using maximum barrier technique (cap and mask, sterile gown, sterile gloves, large sterile sheet, hand hygiene and cutaneous antiseptic). Antibiotic prophylaxis was provided with 2g Ancef administered IV one hour prior to skin incision. Local anesthesia was attained by infiltration with 1% lidocaine with epinephrine. Ultrasound demonstrated patency of the right internal jugular vein, and this was documented with an image. Under real-time ultrasound guidance, this vein was accessed with a 21 gauge micropuncture needle and image documentation was performed. A small dermatotomy was made at the access site with an 11 scalpel. A 0.018" wire was advanced into the SVC and the access needle exchanged for a 31F micropuncture vascular sheath. The 0.018" wire was then removed and a 0.035" wire advanced into the IVC. An appropriate location for the subcutaneous reservoir was selected below the clavicle and an incision was made through the skin and underlying soft  tissues. The subcutaneous tissues were then dissected using a combination of blunt and sharp surgical technique and a pocket was formed. A single lumen power injectable portacatheter was then tunneled through the subcutaneous tissues from the pocket to the dermatotomy and the port reservoir placed within the subcutaneous pocket. The venous access site was then serially dilated and a peel away vascular sheath placed over the wire. The wire was removed and the port catheter advanced into position under fluoroscopic guidance. The catheter tip is positioned in the upper right atrium. This was documented with a spot image. The portacatheter was then tested and found to flush and aspirate well. The port was flushed with saline followed by 100 units/mL heparinized saline. The pocket was then closed in two layers using first subdermal inverted interrupted absorbable sutures followed by a running subcuticular suture. The epidermis was then sealed with Dermabond. The dermatotomy at the venous access site was also closed with a single inverted subdermal suture and the epidermis sealed with Dermabond. IMPRESSION: Successful placement of a right IJ approach Power Port with ultrasound and fluoroscopic guidance. The catheter is ready for use. Signed, Criselda Peaches, MD [facility] Radiology Electronically Signed   By: Jacqulynn Cadet M.D.   On: 06/01/2016 17:28   Ir Fluoro Guide Port Insertion Right  Result Date: 06/01/2016 INDICATION: 56 year old male with ascending colon cancer in need of port catheter for chemotherapy. EXAM: IMPLANTED PORT A CATH PLACEMENT WITH ULTRASOUND AND FLUOROSCOPIC GUIDANCE MEDICATIONS: 2 g Ancef; The antibiotic was administered within an appropriate time interval prior to skin puncture. ANESTHESIA/SEDATION: Versed 4 mg IV; Fentanyl 100 mcg IV; Moderate Sedation Time:  26 minutes The patient was continuously monitored during the  procedure by the  interventional radiology nurse under my direct supervision. FLUOROSCOPY TIME:  0 minutes, 18 seconds (2 mGy) COMPLICATIONS: None immediate. PROCEDURE: The right neck and chest was prepped with chlorhexidine, and draped in the usual sterile fashion using maximum barrier technique (cap and mask, sterile gown, sterile gloves, large sterile sheet, hand hygiene and cutaneous antiseptic). Antibiotic prophylaxis was provided with 2g Ancef administered IV one hour prior to skin incision. Local anesthesia was attained by infiltration with 1% lidocaine with epinephrine. Ultrasound demonstrated patency of the right internal jugular vein, and this was documented with an image. Under real-time ultrasound guidance, this vein was accessed with a 21 gauge micropuncture needle and image documentation was performed. A small dermatotomy was made at the access site with an 11 scalpel. A 0.018" wire was advanced into the SVC and the access needle exchanged for a 64F micropuncture vascular sheath. The 0.018" wire was then removed and a 0.035" wire advanced into the IVC. An appropriate location for the subcutaneous reservoir was selected below the clavicle and an incision was made through the skin and underlying soft tissues. The subcutaneous tissues were then dissected using a combination of blunt and sharp surgical technique and a pocket was formed. A single lumen power injectable portacatheter was then tunneled through the subcutaneous tissues from the pocket to the dermatotomy and the port reservoir placed within the subcutaneous pocket. The venous access site was then serially dilated and a peel away vascular sheath placed over the wire. The wire was removed and the port catheter advanced into position under fluoroscopic guidance. The catheter tip is positioned in the upper right atrium. This was documented with a spot image. The portacatheter was then tested and found to flush and aspirate well. The port was flushed with saline  followed by 100 units/mL heparinized saline. The pocket was then closed in two layers using first subdermal inverted interrupted absorbable sutures followed by a running subcuticular suture. The epidermis was then sealed with Dermabond. The dermatotomy at the venous access site was also closed with a single inverted subdermal suture and the epidermis sealed with Dermabond. IMPRESSION: Successful placement of a right IJ approach Power Port with ultrasound and fluoroscopic guidance. The catheter is ready for use. Signed, Criselda Peaches, MD [facility] Radiology Electronically Signed   By: Jacqulynn Cadet M.D.   On: 06/01/2016 17:28    Labs:  CBC:  Recent Labs  05/30/16 1033 06/01/16 1153 06/21/16 0935  WBC 8.4 7.8 8.6  HGB 12.8* 12.3* 14.2  HCT 38.3* 36.7* 41.0  PLT 242 226 195    COAGS:  Recent Labs  06/01/16 1153  INR 0.97    BMP:  Recent Labs  05/30/16 1033 06/01/16 1153  NA 134* 138  K 4.2 3.4*  CL 100* 103  CO2 28 26  GLUCOSE 93 94  BUN 7 5*  CALCIUM 9.0 9.0  CREATININE 0.54* 0.56*  GFRNONAA >60 >60  GFRAA >60 >60    LIVER FUNCTION TESTS:  Recent Labs  05/30/16 1033  BILITOT 0.5  AST 29  ALT 23  ALKPHOS 111  PROT 6.9  ALBUMIN 3.4*    TUMOR MARKERS:  Recent Labs  05/30/16 1033  CEA 9.8*    Assessment and Plan:  Hx Colon Ca 2016 Right hemicolectomy and debulking 12/2014 Known omental mass ; now with recurrent /enlarging RLQ mass +PET Scheduled for biopsy Risks and Benefits discussed with the patient including, but not limited to bleeding, infection, damage to  adjacent structures or low yield requiring additional tests. All of the patient's questions were answered, patient is agreeable to proceed. Consent signed and in chart.  Thank you for this interesting consult.  I greatly enjoyed meeting MERIT MAYBEE and look forward to participating in their care.  A copy of this report was sent  to the requesting provider on this date.  Electronically Signed: Janessa Mickle A 06/21/2016, 10:26 AM   I spent a total of  30 Minutes   in face to face in clinical consultation, greater than 50% of which was counseling/coordinating care for RLQ mass biopsy

## 2016-06-21 NOTE — Discharge Instructions (Signed)

## 2016-06-21 NOTE — Sedation Documentation (Signed)
O2 2l/[place] applied 

## 2016-06-21 NOTE — Procedures (Signed)
Interventional Radiology Procedure Note  Procedure: CT guided biopsy of RLQ abdominal mass  Complications: None  Estimated Blood Loss: < 10 mL  18 G core biopsy x 4 via 17 G needle of RLQ peritoneal mass.    Venetia Night. Kathlene Cote, M.D Pager:  [phone]

## 2016-06-28 ENCOUNTER — Encounter (HOSPITAL_COMMUNITY): Payer: Self-pay

## 2016-06-28 ENCOUNTER — Encounter (HOSPITAL_BASED_OUTPATIENT_CLINIC_OR_DEPARTMENT_OTHER): Payer: BLUE CROSS/BLUE SHIELD | Admitting: Oncology

## 2016-06-28 VITALS — BP 138/97 | HR 82 | Temp 98.7°F | Resp 18 | Wt 181.2 lb

## 2016-06-28 DIAGNOSIS — C182 Malignant neoplasm of ascending colon: Secondary | ICD-10-CM | POA: Diagnosis not present

## 2016-06-28 DIAGNOSIS — K469 Unspecified abdominal hernia without obstruction or gangrene: Secondary | ICD-10-CM

## 2016-06-28 DIAGNOSIS — L253 Unspecified contact dermatitis due to other chemical products: Secondary | ICD-10-CM

## 2016-06-28 DIAGNOSIS — R1900 Intra-abdominal and pelvic swelling, mass and lump, unspecified site: Secondary | ICD-10-CM

## 2016-06-28 DIAGNOSIS — K4090 Unilateral inguinal hernia, without obstruction or gangrene, not specified as recurrent: Secondary | ICD-10-CM | POA: Diagnosis not present

## 2016-06-28 DIAGNOSIS — M7989 Other specified soft tissue disorders: Secondary | ICD-10-CM

## 2016-06-28 DIAGNOSIS — Z720 Tobacco use: Secondary | ICD-10-CM

## 2016-06-28 NOTE — Patient Instructions (Signed)
[place] at [facility] Discharge Instructions  RECOMMENDATIONS MADE BY THE CONSULTANT AND ANY TEST RESULTS WILL BE SENT TO YOUR REFERRING PHYSICIAN.  You were seen today by Dr. Twana First Follow up in 6 weeks with lab work   Thank you for choosing [place] at [facility] to provide your oncology and hematology care.  To afford each patient quality time with our provider, please arrive at least 15 minutes before your scheduled appointment time.    If you have a lab appointment with the [facility] please come in thru the  Main Entrance and check in at the main information desk  You need to re-schedule your appointment should you arrive 10 or more minutes late.  We strive to give you quality time with our providers, and arriving late affects you and other patients whose appointments are after yours.  Also, if you no show three or more times for appointments you may be dismissed from the clinic at the providers discretion.     Again, thank you for choosing [facility].  Our hope is that these requests will decrease the amount of time that you wait before being seen by our physicians.       _____________________________________________________________  Should you have questions after your visit to [facility], please contact our office at [phone] between the hours of 8:30 a.m. and 4:30 p.m.  Voicemails left after 4:30 p.m. will not be returned until the following business day.  For prescription refill requests, have your pharmacy contact our office.       Resources For Cancer Patients and their Caregivers ? American Cancer Society: Can assist with transportation, wigs, general needs, runs Look Good Feel Better.        548-046-4315 ? Cancer Care: Provides financial assistance, online support groups, medication/co-pay assistance.  1-800-813-HOPE 859 383 6058) ? [place] Assists  [place] cancer patients and their families through emotional , educational and financial support.  (225) 571-8413 ? [place] DSS Where to apply for food stamps, Medicaid and utility assistance. 5058157862 ? [facility]: Transportation to medical appointments. 405-291-1842 ? Social Security Administration: May apply for disability if have a Stage IV cancer. (812) 022-7097 (404)690-6747 ? [employer], Disability and Transit Services: Assists with nutrition, care and transit needs. [place] Support Programs: @10RELATIVEDAYS @ > Cancer Support Group  2nd Tuesday of the month 1pm-2pm, Journey Room  > Creative Journey  3rd Tuesday of the month 1130am-1pm, Journey Room  > Look Good Feel Better  1st Wednesday of the month 10am-12 noon, Journey Room (Call [place] to register [phone])

## 2016-06-28 NOTE — Progress Notes (Signed)
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 Negative MRI appearance of the brain.      06/08/2016 PET scan    IMPRESSION: 1. 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 2. Difficult to exclude additional small foci of peritoneal tumor in the pelvis. Small asymmetric right inguinal and external iliac nodes are mildly hypermetabolic, nonspecific. Correlation with prior imaging results recommended. 3. No evidence of hepatic or thoracic metastatic disease. 4. Subacute rib fractures on the left with associated metabolic activity. 5. Mild esophageal activity, possibly esophagitis. 6. Ventral and left inguinal hernias containing colon. Hepatic steatosis.       06/21/2016 Pathology Results    Soft Tissue Needle Core Biopsy, Right lower quadrant peritoneal mass - ADENOCARCINOMA, CONSISTENT WITH COLONIC PRIMARY.      HISTORY OF PRESENTING ILLNESS:  Edward Graham 56 y.o. male is here because of pT4a, N0 right sided colon cancer with a questionable 8.8 cm mass separate from the primary grouped with the omentum diagnosed 10/2014. He initially presented with right sided abdominal pain and complaint that his right flank was becoming progressively erythematous and edematous. Patient was previously being followed by Dr. Vallarie Mare of [facility] in [place].  He presented to the ED on 10/26/2014 with abdominal pain, a CT showed suspicion for right cecal mass with  perforation and decompression into the subcutaneous fat of the right lower abdominal wall and of the right pelvic side wall. He underwent a right hemicolectomy and debulking on 11/30/2014 and low grade mucinous adenocarcinoma was found without definitive invasion of neighboring organs. 33 LN were removed and all were negative for malignancy. Radial margins were unable to be evaluated due to fragmented specimen and perforation. There was a separate 8.8 cm tumor of the omentum was found as well, it was unclear whether this was an implant or a fragmented primary. The omentum itself was negative. He had multiple surgical would infections requiring debridement following his hemicolectomy. He was discharged on 11/12/14. Prior to starting adjuvant therapy he had baseline scans performed which were negative for metastatic disease.  He has received adjuvant CAPEOX, with progression seen on scans in the RLQ mass. He has moved to the [place] area and is transferring care. He was last seen at [facility] in [place] on 03/02/2016. He had a biopsy scheduled of the RLQ abdominal mass in march 2018, but did not show up.    He presents with his father, who is taking care of his medical care. He has not been doing well. He states that since his colectomy, he has a lot of diarrhea, about 15-20 bowel movements a day, without relief for Imodium. 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 He also has chronic vertigo/balance issues. Now he has a new onset rash. His sister washed his clothes in a new detergent and now he has a rash on his whole body. He has a history of eczema and the rash has now plaqued over. He tried hydrocortisone cream, but it  did not help. The rash is painful. Since January, he has not lost any weight. He uses an inhaler for allergies. The skin on the bottoms of his feet are cracking and painful. He reports leg swelling and abdominal pain. Denies dehydration, headaches, chest  pain, SOB, or any other concerns.   INTERVAL HISTORY: Edward Graham presents to the clinic today for follow up. He is accompanied by his father today.  Since his last visit patient has undergone biopsy of the retroperitoneal mass and surgical path demonstrated adenocarcinoma, consistent with a colon primary.  Patient states that he has been having a lot of abdominal pain as well as left inguinal pain from his hernias. Patient still has a persistent rash which she states is pruritic. He states that his diarrhea has improved tremendously. Denies any chest pain, shortness breath, decreased appetite, fatigue.  MEDICAL HISTORY:  Past Medical History:  Diagnosis Date  . Allergy   . Anxiety   . Colon cancer ([place])   . Depression   . Neuromuscular disorder [facility])     SURGICAL HISTORY: Past Surgical History:  Procedure Laterality Date  . COLON RESECTION    . IR FLUORO GUIDE PORT INSERTION RIGHT  06/01/2016  . IR US GUIDE VASC ACCESS RIGHT  06/01/2016    SOCIAL HISTORY: Social History   Social History  . Marital status: Divorced    Spouse name: N/A  . Number of children: N/A  . Years of education: N/A   Occupational History  . Not on file.   Social History Main Topics  . Smoking status: Current Every Day Smoker    Packs/day: 1.00    Years: 40.00    Types: Cigarettes  . Smokeless tobacco: Never Used  . Alcohol use Yes     Comment: states he drinks like a fish  . Drug use: Yes    Types: "Crack" cocaine, Marijuana     Comment: quit age 58  . Sexual activity: Not on file     Comment: divorced   Other Topics Concern  . Not on file   Social History Narrative  . No narrative on file    FAMILY HISTORY: Family History  Problem Relation Age of Onset  . Emphysema Mother   . Asthma Sister     ALLERGIES:  is allergic to dust mite extract; mold extract [trichophyton]; and tree extract.  MEDICATIONS:  Current Outpatient Prescriptions  Medication Sig Dispense Refill  .  tamsulosin (FLOMAX) 0.4 MG CAPS capsule Take by mouth.    Marland Kitchen albuterol (PROVENTIL HFA;VENTOLIN HFA) 108 (90 Base) MCG/ACT inhaler Inhale 2 puffs into the lungs every 6 (six) hours as needed for wheezing or shortness of breath. 1 Inhaler 2  . levocetirizine (XYZAL) 5 MG tablet Take 5 mg by mouth every evening.     . lidocaine-prilocaine (EMLA) cream Apply 1 application topically as needed. 30 g 1  . Multiple Vitamin (THERA) TABS Take 1 tablet by mouth daily.     . Potassium 99 MG TABS Take 400 mg by mouth daily.     No current facility-administered medications for this visit.    Review of Systems  Constitutional: Negative.  Negative for weight loss.       Good appetite Abnormal sleep schedule with afternoon naps  HENT: Negative.   Eyes: Negative.   Respiratory: Negative.  Negative for shortness of breath.   Cardiovascular: Negative for chest pain and leg swelling.  Gastrointestinal: Positive for abdominal pain ([place]).  Large abdominal hernia has been bothering him and causing pain  Genitourinary: Negative.   Musculoskeletal: Negative.   Skin: Positive for rash (whole body, improving).       Dry, cracking skin on feet  Neurological: Positive for tingling (hands and feet, severe). Negative for headaches.       Balance problems  Endo/Heme/Allergies: Negative.   Psychiatric/Behavioral: Negative.   All other systems reviewed and are negative. 14 point ROS was done and is otherwise as detailed above or in HPI  PHYSICAL EXAMINATION: ECOG PERFORMANCE STATUS: 1 - Symptomatic but completely ambulatory  Vitals:   06/28/16 1218  BP: (!) 138/97  Pulse: 82  Resp: 18  Temp: 98.7 F (37.1 C)   Filed Weights   06/28/16 1218  Weight: 181 lb 3.2 oz (82.2 kg)   Physical Exam  Constitutional: He is oriented to person, place, and time and well-developed, well-nourished, and in no distress.  HENT:  Head: Normocephalic and atraumatic.  Mouth/Throat: Oropharynx is clear and moist.  Eyes:  Conjunctivae and EOM are normal. Pupils are equal, round, and reactive to light.  Neck: Normal range of motion. Neck supple.  Cardiovascular: Normal rate, regular rhythm and normal heart sounds.  Exam reveals no gallop and no friction rub.   No murmur heard. Pulmonary/Chest: Effort normal and breath sounds normal. No respiratory distress. He has no wheezes. He has no rales. He exhibits no tenderness.  Abdominal: Soft. Bowel sounds are normal. He exhibits no distension. There is no tenderness. There is no rebound and no guarding.  2 large abdominal hernias, nontender, large surgical scars well healed  Musculoskeletal: Normal range of motion. He exhibits no edema.  Neurological: He is alert and oriented to person, place, and time. Gait normal.  Skin: Skin is warm and dry. No rash noted. No erythema.  Rash demonstrates improvement, mild rash now with areas of skin scabbing over from excoriation.  Nursing note and vitals reviewed.  LABORATORY DATA:  I have reviewed the data as listed Lab Results  Component Value Date   WBC 8.6 06/21/2016   HGB 14.2 06/21/2016   HCT 41.0 06/21/2016   MCV 102.8 (H) 06/21/2016   PLT 195 06/21/2016   CMP     Component Value Date/Time   NA 138 06/01/2016 1153   K 3.4 (L) 06/01/2016 1153   CL 103 06/01/2016 1153   CO2 26 06/01/2016 1153   GLUCOSE 94 06/01/2016 1153   BUN 5 (L) 06/01/2016 1153   CREATININE 0.56 (L) 06/01/2016 1153   CALCIUM 9.0 06/01/2016 1153   PROT 6.9 05/30/2016 1033   ALBUMIN 3.4 (L) 05/30/2016 1033   AST 29 05/30/2016 1033   ALT 23 05/30/2016 1033   ALKPHOS 111 05/30/2016 1033   BILITOT 0.5 05/30/2016 1033   GFRNONAA >60 06/01/2016 1153   GFRAA >60 06/01/2016 1153   PATHOLOGY: 06/21/16 Soft Tissue Needle Core Biopsy, Right lower quadrant peritoneal mass - ADENOCARCINOMA, CONSISTENT WITH COLONIC PRIMARY.  RADIOGRAPHIC STUDIES: I have personally reviewed the radiological images as listed and agreed with the findings in the  report.  CT Abdomen 03/04/2016 IMPRESSION: 1. Findings highly concerning for disease progression with interval increase in the soft tissue mass within the right lower quadrant.  2. Apparent thickening of the rectum as can be seen in the setting of proctitis.  3. Hepatic stenosis.   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 Chronic diarrhea following colectomy. Diffuse contact dermatitis due to allergy from detergent Right foot swelling  PLAN: - I have reviewed patient's CT guided biopsy of the retroperitoneal mass results in detail with the patient. This is consistent with metastatic disease from a colon primary. - Patient states that he is having a lot of pain from his abdominal and inguinal hernias and states that he is interested to get a surgical evaluation to see if he can have them repaired. I have told patient that he may be a difficult case given his previous multiple abdominal surgeries. Patient's also interested to see if this retroperitoneal mass can be resected. I will discuss with Dr. Eugenia Pancoast at [facility] to see what he thinks. -If the patient is not candidate for surgical resection of the retroperitoneal mass. I have discussed chemotherapy with FOLFIRI +avastin with the patient. He states he would like to see what surgery says first. - RTC in 6 weeks for follow up, sooner if he is found to not be a surgical candidate. Patient verbalized understanding.    ORDERS PLACED FOR THIS ENCOUNTER: Orders Placed This Encounter  Procedures  . CBC with Differential    Standing Status:   Future    Standing Expiration Date:   06/28/2017  . Comprehensive metabolic panel    Standing Status:   Future    Standing Expiration Date:   06/28/2017    . CEA    Standing Status:   Future    Standing Expiration Date:   06/28/2017    All questions were answered. The patient knows to call the clinic with any problems, questions or concerns.   This note was electronically signed by:   Twana First, MD 06/28/2016 3:50 PM

## 2016-07-05 ENCOUNTER — Encounter (HOSPITAL_COMMUNITY): Payer: Self-pay | Admitting: Lab

## 2016-07-05 NOTE — Progress Notes (Unsigned)
Referral sent to [facility]/ Dr Crisoforo Oxford appt 6/5  @2 . Patient is aware of appt. Records faxed on 5/31

## 2016-07-19 DIAGNOSIS — C801 Malignant (primary) neoplasm, unspecified: Secondary | ICD-10-CM

## 2016-07-19 DIAGNOSIS — C786 Secondary malignant neoplasm of retroperitoneum and peritoneum: Secondary | ICD-10-CM | POA: Insufficient documentation

## 2016-07-26 DIAGNOSIS — K4090 Unilateral inguinal hernia, without obstruction or gangrene, not specified as recurrent: Secondary | ICD-10-CM | POA: Insufficient documentation

## 2016-08-02 ENCOUNTER — Encounter (HOSPITAL_COMMUNITY): Payer: Self-pay | Admitting: Emergency Medicine

## 2016-08-02 ENCOUNTER — Emergency Department (HOSPITAL_COMMUNITY)
Admission: EM | Admit: 2016-08-02 | Discharge: 2016-08-02 | Disposition: A | Discharge status: Home / Self Care | Payer: BLUE CROSS/BLUE SHIELD | Attending: Emergency Medicine | Admitting: Emergency Medicine

## 2016-08-02 DIAGNOSIS — Z79899 Other long term (current) drug therapy: Secondary | ICD-10-CM | POA: Diagnosis not present

## 2016-08-02 DIAGNOSIS — K439 Ventral hernia without obstruction or gangrene: Secondary | ICD-10-CM | POA: Insufficient documentation

## 2016-08-02 DIAGNOSIS — Z85038 Personal history of other malignant neoplasm of large intestine: Secondary | ICD-10-CM | POA: Diagnosis not present

## 2016-08-02 DIAGNOSIS — F17210 Nicotine dependence, cigarettes, uncomplicated: Secondary | ICD-10-CM | POA: Insufficient documentation

## 2016-08-02 DIAGNOSIS — R21 Rash and other nonspecific skin eruption: Secondary | ICD-10-CM | POA: Insufficient documentation

## 2016-08-02 MED ORDER — HYDROXYZINE HCL 25 MG PO TABS: 25.0000 mg | ORAL_TABLET | Freq: Four times a day (QID) | ORAL | 0 refills | Status: AC | PRN

## 2016-08-02 MED ORDER — PREDNISONE 20 MG PO TABS: ORAL_TABLET | ORAL | 0 refills | Status: DC

## 2016-08-02 NOTE — ED Triage Notes (Signed)
Pt has a full body rash that started 2 weeks ago. Pt went to there dermatologist about this but the meds they gave him were not of any help. He currently has cancer,denies any chemo/radiation. Denies any sob or breathing problems.

## 2016-08-03 NOTE — ED Provider Notes (Signed)
[facility] DEPT Provider Note   CSN: [id number] Arrival date & time: 08/02/16  1451     History   Chief Complaint Chief Complaint  Patient presents with  . Rash    HPI Edward Graham is a 56 y.o. male.  HPI Patient with history of chronic eczema and extensive allergies presents with worsening diffuse pruritic rash covering his entire body. States he's had a rash for several weeks. Has seen a dermatologist in [place] in the past who gave him an extended course of steroids for similar exacerbations of his chronic rash. Denies any intraoral lesions. Thinks the exacerbation started after washing is closed in a different detergent. Has been using over-the-counter Benadryl and had short course of prednisone with little improvement. Denies fever or chills. Past Medical History:  Diagnosis Date  . Allergy   . Anxiety   . Colon cancer ([place])   . Depression   . Neuromuscular disorder [facility])     Patient Active Problem List   Diagnosis Date Noted  . Right leg swelling 05/30/2016  . Abscess or cellulitis, neck 07/07/2015  . Allergic reaction 04/28/2015  . 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 Eczema 04/28/2015  . Hypokalemia 04/07/2015  . Cutaneous abscess of neck 04/07/2015  . Flea bite of multiple sites 04/07/2015  . Rash 04/07/2015  . Diarrhea 02/27/2015  . CINV (chemotherapy-induced nausea and vomiting) 02/27/2015  . Cold sensitivity 02/27/2015  . Rash due to allergy 02/27/2015  . Malignant neoplasm of ascending colon ([place]) 12/09/2014  . Alcohol withdrawal delirium ([place]) 11/12/2014  . Severe protein-calorie malnutrition ([place]) 11/05/2014  . Metabolic encephalopathy 38/25/0539  . Abdominal wall abscess 10/27/2014  . Colonic mass 10/27/2014  . Sepsis ([place]) 10/27/2014    Past Surgical History:  Procedure Laterality Date  . COLON RESECTION    . IR FLUORO GUIDE PORT INSERTION RIGHT  06/01/2016  . IR US GUIDE VASC ACCESS RIGHT  06/01/2016       Home Medications      Prior to Admission medications   Medication Sig Start Date End Date Taking? Authorizing Provider  albuterol (PROVENTIL HFA;VENTOLIN HFA) 108 (90 Base) MCG/ACT inhaler Inhale 2 puffs into the lungs every 6 (six) hours as needed for wheezing or shortness of breath. 06/13/16  Yes Twana First, MD  diphenoxylate-atropine (LOMOTIL) 2.5-0.025 MG tablet TAKE 1 TABLET BY MOUTH ONCE DAILY 05/30/16  Yes [provider]  HYDROcodone-acetaminophen (NORCO/VICODIN) 5-325 MG tablet Take 1 tablet by mouth every 6 (six) hours as needed for moderate pain.   Yes [provider]  levocetirizine (XYZAL) 5 MG tablet Take 5 mg by mouth every evening.    Yes [provider]  lidocaine-prilocaine (EMLA) cream Apply 1 application topically as needed. 06/13/16  Yes Twana First, MD  Multiple Vitamin (THERA) TABS Take 1 tablet by mouth daily.    Yes [provider]  mupirocin ointment (BACTROBAN) 2 % APPLY TO AFFECTED AREA AND INSIDE NOSE UP TO 3 TIMES A DAY AS NEEDED 07/26/16  Yes [provider]  Potassium 99 MG TABS Take 400 mg by mouth daily.   Yes [provider]  triamcinolone cream (KENALOG) 0.1 % APPLY TO AFFECTED AREA UP TO TWICE A DAY AS NEEDED (NOT TO FACE, GROIN, OR UNDERARMS) 07/26/16  Yes [provider]  hydrOXYzine (ATARAX/VISTARIL) 25 MG tablet Take 1 tablet (25 mg total) by mouth every 6 (six) hours as needed for itching. 08/02/16   Julianne Rice, MD  predniSONE (DELTASONE) 20 MG tablet 3 tabs po  daily x 3 days, then 2 tabs x 3 days, then 1.5 tabs x 3 days, then 1 tab x 3 days, then 0.5 tabs x 3 days 08/02/16   Julianne Rice, MD    Family History Family History  Problem Relation Age of Onset  . Emphysema Mother   . Asthma Sister     Social History Social History  Substance Use Topics  . Smoking status: Current Every Day Smoker    Packs/day: 1.00    Years: 40.00    Types: Cigarettes  . Smokeless tobacco: Never Used  . Alcohol use Yes      Comment: 6 pack a week     Allergies   Dust mite extract; Mold extract [trichophyton]; and Tree extract   Review of Systems Review of Systems  Constitutional: Negative for chills, fatigue and fever.  HENT: Negative for facial swelling, sore throat, trouble swallowing and voice change.   Eyes: Negative for visual disturbance.  Respiratory: Negative for cough and shortness of breath.   Cardiovascular: Negative for chest pain.  Gastrointestinal: Negative for abdominal pain, nausea and vomiting.  Musculoskeletal: Negative for back pain, myalgias, neck pain and neck stiffness.  Skin: Positive for rash.  Neurological: Negative for dizziness, weakness, numbness and headaches.  All other systems reviewed and are negative.    Physical Exam Updated Vital Signs BP 119/80 (BP Location: Right Arm)   Pulse 72   Temp 97.9 F (36.6 C) (Oral)   Resp 20   Ht 5\' 11"  (1.803 m)   Wt 84.4 kg (186 lb)   SpO2 98%   BMI 25.94 kg/m   Physical Exam  Constitutional: He is oriented to person, place, and time. He appears well-developed and well-nourished. No distress.  HENT:  Head: Normocephalic and atraumatic.  Mouth/Throat: Oropharynx is clear and moist. No oropharyngeal exudate.  No intraoral lesions. No intraoral swelling or facial swelling.  Eyes: EOM are normal. Pupils are equal, round, and reactive to light.  Neck: Normal range of motion. Neck supple.  Cardiovascular: Normal rate and regular rhythm.   Pulmonary/Chest: Effort normal and breath sounds normal. No stridor. No respiratory distress. He has no wheezes. He has no rales. He exhibits no tenderness.  Abdominal: Soft. Bowel sounds are normal. There is no tenderness. There is no rebound and no guarding.  Multiple ventral wall hernias that are easily reduced.  Musculoskeletal: Normal range of motion. He exhibits no edema or tenderness.  Neurological: He is alert and oriented to person, place, and time.  Skin: Skin is warm and dry.  Rash noted. No erythema.  Patient with maculopapular erythematous rash covering her trunk and extremities. There multiple areas of excoriation. No evidence of secondary infection.  Psychiatric: He has a normal mood and affect. His behavior is normal.  Nursing note and vitals reviewed.    ED Treatments / Results  Labs (all labs ordered are listed, but only abnormal results are displayed) Labs Reviewed - No data to display  EKG  EKG Interpretation None       Radiology No results found.  Procedures Procedures (including critical care time)  Medications Ordered in ED Medications - No data to display   Initial Impression / Assessment and Plan / ED Course  I have reviewed the triage vital signs and the nursing notes.  Pertinent labs & imaging results that were available during my care of the patient were reviewed by me and considered in my medical decision making (see chart for details).     Will  start on longer course of prednisone. Given Atarax for itching. Will need to follow-up with his dermatologist.  Final Clinical Impressions(s) / ED Diagnoses   Final diagnoses:  Rash of entire body    New Prescriptions Discharge Medication List as of 08/02/2016  4:37 PM    START taking these medications   Details  hydrOXYzine (ATARAX/VISTARIL) 25 MG tablet Take 1 tablet (25 mg total) by mouth every 6 (six) hours as needed for itching., Starting Thu 08/02/2016, Print         Julianne Rice, MD 08/03/16 1635

## 2016-08-13 ENCOUNTER — Ambulatory Visit (HOSPITAL_COMMUNITY): Payer: BLUE CROSS/BLUE SHIELD

## 2016-08-13 ENCOUNTER — Other Ambulatory Visit (HOSPITAL_COMMUNITY): Payer: BLUE CROSS/BLUE SHIELD

## 2016-09-05 ENCOUNTER — Encounter (HOSPITAL_COMMUNITY): Payer: Self-pay

## 2016-09-05 ENCOUNTER — Encounter (HOSPITAL_COMMUNITY): Payer: BLUE CROSS/BLUE SHIELD

## 2016-09-05 ENCOUNTER — Encounter (HOSPITAL_COMMUNITY): Payer: BLUE CROSS/BLUE SHIELD | Attending: Oncology | Admitting: Oncology

## 2016-09-05 ENCOUNTER — Other Ambulatory Visit (HOSPITAL_COMMUNITY): Payer: BLUE CROSS/BLUE SHIELD

## 2016-09-05 VITALS — BP 132/81 | HR 85 | Temp 98.2°F | Resp 18 | Wt 195.0 lb

## 2016-09-05 DIAGNOSIS — C182 Malignant neoplasm of ascending colon: Secondary | ICD-10-CM | POA: Diagnosis not present

## 2016-09-05 DIAGNOSIS — L299 Pruritus, unspecified: Secondary | ICD-10-CM | POA: Diagnosis present

## 2016-09-05 DIAGNOSIS — K4090 Unilateral inguinal hernia, without obstruction or gangrene, not specified as recurrent: Secondary | ICD-10-CM | POA: Diagnosis not present

## 2016-09-05 DIAGNOSIS — R21 Rash and other nonspecific skin eruption: Secondary | ICD-10-CM | POA: Diagnosis not present

## 2016-09-05 DIAGNOSIS — Z720 Tobacco use: Secondary | ICD-10-CM

## 2016-09-05 DIAGNOSIS — R1900 Intra-abdominal and pelvic swelling, mass and lump, unspecified site: Secondary | ICD-10-CM | POA: Diagnosis not present

## 2016-09-05 LAB — CBC WITH DIFFERENTIAL/PLATELET
BASOS PCT: 0 %
Basophils Absolute: 0 10*3/uL (ref 0.0–0.1)
EOS ABS: 0.2 10*3/uL (ref 0.0–0.7)
EOS PCT: 2 %
HCT: 39.7 % (ref 39.0–52.0)
HEMOGLOBIN: 14 g/dL (ref 13.0–17.0)
Lymphocytes Relative: 10 %
Lymphs Abs: 0.9 10*3/uL (ref 0.7–4.0)
MCH: 33.7 pg (ref 26.0–34.0)
MCHC: 35.3 g/dL (ref 30.0–36.0)
MCV: 95.7 fL (ref 78.0–100.0)
MONOS PCT: 10 %
Monocytes Absolute: 0.9 10*3/uL (ref 0.1–1.0)
NEUTROS PCT: 78 %
Neutro Abs: 7.3 10*3/uL (ref 1.7–7.7)
PLATELETS: 197 10*3/uL (ref 150–400)
RBC: 4.15 MIL/uL — ABNORMAL LOW (ref 4.22–5.81)
RDW: 14.7 % (ref 11.5–15.5)
WBC: 9.3 10*3/uL (ref 4.0–10.5)

## 2016-09-05 LAB — COMPREHENSIVE METABOLIC PANEL
ALBUMIN: 3.3 g/dL — AB (ref 3.5–5.0)
ALK PHOS: 114 U/L (ref 38–126)
ALT: 53 U/L (ref 17–63)
ANION GAP: 11 (ref 5–15)
AST: 63 U/L — ABNORMAL HIGH (ref 15–41)
BUN: 5 mg/dL — ABNORMAL LOW (ref 6–20)
CALCIUM: 8.2 mg/dL — AB (ref 8.9–10.3)
CHLORIDE: 92 mmol/L — AB (ref 101–111)
CO2: 26 mmol/L (ref 22–32)
Creatinine, Ser: 0.84 mg/dL (ref 0.61–1.24)
GFR calc non Af Amer: >60 mL/min (ref >60)
GLUCOSE: 96 mg/dL (ref 65–99)
Potassium: 3.5 mmol/L (ref 3.5–5.1)
SODIUM: 129 mmol/L — AB (ref 135–145)
Total Bilirubin: 1 mg/dL (ref 0.3–1.2)
Total Protein: 6.3 g/dL — ABNORMAL LOW (ref 6.5–8.1)

## 2016-09-05 MED ORDER — DIPHENOXYLATE-ATROPINE 2.5-0.025 MG PO TABS: 1.0000 | ORAL_TABLET | Freq: Every day | ORAL | 0 refills | Status: AC

## 2016-09-05 MED ORDER — ONDANSETRON HCL 8 MG PO TABS: 8.0000 mg | ORAL_TABLET | Freq: Two times a day (BID) | ORAL | 1 refills | Status: DC | PRN

## 2016-09-05 MED ORDER — LIDOCAINE-PRILOCAINE 2.5-2.5 % EX CREA: TOPICAL_CREAM | CUTANEOUS | 3 refills | Status: DC

## 2016-09-05 MED ORDER — HEPARIN SOD (PORK) LOCK FLUSH 100 UNIT/ML IV SOLN
500.0000 [IU] | Freq: Once | INTRAVENOUS | Status: AC
  Administered 2016-09-05: 500 [IU] via INTRAVENOUS

## 2016-09-05 MED ORDER — SODIUM CHLORIDE 0.9% FLUSH
10.0000 mL | INTRAVENOUS | Status: DC | PRN
  Administered 2016-09-05: 10 mL via INTRAVENOUS
  Filled 2016-09-05: qty 10

## 2016-09-05 MED ORDER — DEXAMETHASONE 4 MG PO TABS: 8.0000 mg | ORAL_TABLET | Freq: Every day | ORAL | 1 refills | Status: DC

## 2016-09-05 MED ORDER — ALBUTEROL SULFATE HFA 108 (90 BASE) MCG/ACT IN AERS: 2.0000 | INHALATION_SPRAY | Freq: Four times a day (QID) | RESPIRATORY_TRACT | 2 refills | Status: DC | PRN

## 2016-09-05 MED ORDER — PROCHLORPERAZINE MALEATE 10 MG PO TABS: 10.0000 mg | ORAL_TABLET | Freq: Four times a day (QID) | ORAL | 1 refills | Status: DC | PRN

## 2016-09-05 NOTE — Progress Notes (Signed)
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 Negative MRI appearance of the brain.      06/08/2016 PET scan    IMPRESSION: 1. 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 2. Difficult to exclude additional small foci of peritoneal tumor in the pelvis. Small asymmetric right inguinal and external iliac nodes are mildly hypermetabolic, nonspecific. Correlation with prior imaging results recommended. 3. No evidence of hepatic or thoracic metastatic disease. 4. Subacute rib fractures on the left with associated metabolic activity. 5. Mild esophageal activity, possibly esophagitis. 6. Ventral and left inguinal hernias containing colon. Hepatic steatosis.       06/21/2016 Pathology Results    Soft Tissue Needle Core Biopsy, Right lower quadrant peritoneal mass - ADENOCARCINOMA, CONSISTENT WITH COLONIC PRIMARY.      HISTORY OF PRESENTING ILLNESS:  Edward Graham 56 y.o. male is here because of pT4a, N0 right sided colon cancer with a questionable 8.8 cm mass separate from the primary grouped with the omentum diagnosed 10/2014. He initially presented with right sided abdominal pain and complaint that his right flank was becoming progressively erythematous and edematous. Patient was previously being followed by Dr. Vallarie Mare of [facility] in [place].  He presented to the ED on 10/26/2014 with abdominal pain, a CT showed suspicion for right cecal mass with  perforation and decompression into the subcutaneous fat of the right lower abdominal wall and of the right pelvic side wall. He underwent a right hemicolectomy and debulking on 11/30/2014 and low grade mucinous adenocarcinoma was found without definitive invasion of neighboring organs. 33 LN were removed and all were negative for malignancy. Radial margins were unable to be evaluated due to fragmented specimen and perforation. There was a separate 8.8 cm tumor of the omentum was found as well, it was unclear whether this was an implant or a fragmented primary. The omentum itself was negative. He had multiple surgical would infections requiring debridement following his hemicolectomy. He was discharged on 11/12/14. Prior to starting adjuvant therapy he had baseline scans performed which were negative for metastatic disease.  He has received adjuvant CAPEOX, with progression seen on scans in the RLQ mass. He has moved to the [place] area and is transferring care. He was last seen at [facility] in [place] on 03/02/2016. He had a biopsy scheduled of the RLQ abdominal mass in march 2018, but did not show up.    He presents with his father, who is taking care of his medical care. He has not been doing well. He states that since his colectomy, he has a lot of diarrhea, about 15-20 bowel movements a day, without relief for Imodium. 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 He also has chronic vertigo/balance issues. Now he has a new onset rash. His sister washed his clothes in a new detergent and now he has a rash on his whole body. He has a history of eczema and the rash has now plaqued over. He tried hydrocortisone cream, but it  did not help. The rash is painful. Since January, he has not lost any weight. He uses an inhaler for allergies. The skin on the bottoms of his feet are cracking and painful. He reports leg swelling and abdominal pain. Denies dehydration, headaches, chest  pain, SOB, or any other concerns.   INTERVAL HISTORY: Edward Graham presents to the clinic today for follow up. He is accompanied by his father today.  Patient presents with his father today for continued follow-up of his colon cancer. Since his last visit he has seen Dr. Crisoforo Oxford at [facility] for surgical evaluation of the retroperitoneal mass as well as his hernias. Since his last visit patient has had his inguinal hernia repaired at [facility] and he is no longer experiencing excruciating pain. Dr. Crisoforo Oxford has recommended for patient to receive about 6-8 cycles of chemotherapy with restaging prior to him resecting his retroperitoneal mass. We'll also fix patient's abdominal hernia at the time of the surgical resection. Patient states that overall he has been doing well. His diarrhea has improved and he takes Lomotil when necessary. His main complaint is regarding his diffuse rash. He wishes to see an allergist for his rash. He states that he has been eating well. He denies any chest pain, shortness breath, abdominal pain, focal weakness.  MEDICAL HISTORY:  Past Medical History:  Diagnosis Date  . Allergy   . Anxiety   . Colon cancer ([place])   . Depression   . Neuromuscular disorder [facility])     SURGICAL HISTORY: Past Surgical History:  Procedure Laterality Date  . COLON RESECTION    . IR FLUORO GUIDE PORT INSERTION RIGHT  06/01/2016  . IR US GUIDE VASC ACCESS RIGHT  06/01/2016    SOCIAL HISTORY: Social History   Social History  . Marital status: Divorced    Spouse name: N/A  . Number of children: N/A  . Years of education: N/A   Occupational History  . Not on file.   Social History Main Topics  . Smoking status: Current Every Day Smoker    Packs/day: 1.00    Years: 40.00    Types: Cigarettes  . Smokeless tobacco: Never Used  . Alcohol use Yes     Comment: 6 pack a week  . Drug use: No  . Sexual activity: Not on file     Comment: divorced   Other Topics Concern  . Not on file     Social History Narrative  . No narrative on file    FAMILY HISTORY: Family History  Problem Relation Age of Onset  . Emphysema Mother   . Asthma Sister     ALLERGIES:  is allergic to dust mite extract; mold extract [trichophyton]; and tree extract.  MEDICATIONS:  Current Outpatient Prescriptions  Medication Sig Dispense Refill  . albuterol (PROVENTIL HFA;VENTOLIN HFA) 108 (90 Base) MCG/ACT inhaler Inhale 2 puffs into the lungs every 6 (six) hours as needed for wheezing or shortness of breath. 1 Inhaler 2  . diphenoxylate-atropine (LOMOTIL) 2.5-0.025 MG tablet Take 1 tablet by mouth daily. 30 tablet 0  . HYDROcodone-acetaminophen (NORCO/VICODIN) 5-325 MG tablet Take 1 tablet by mouth every 6 (six) hours as needed for moderate pain.    . hydrOXYzine (ATARAX/VISTARIL) 25 MG tablet Take 1 tablet (25 mg total) by mouth every 6 (six) hours as needed for itching. 30 tablet 0  . levocetirizine (XYZAL) 5 MG tablet Take 5 mg by mouth every evening.     . lidocaine-prilocaine (EMLA) cream Apply 1  application topically as needed. 30 g 1  . Multiple Vitamin (THERA) TABS Take 1 tablet by mouth daily.     . mupirocin ointment (BACTROBAN) 2 % APPLY TO AFFECTED AREA AND INSIDE NOSE UP TO 3 TIMES A DAY AS NEEDED  99  . Potassium 99 MG TABS Take 400 mg by mouth daily.    . predniSONE (DELTASONE) 20 MG tablet 3 tabs po daily x 3 days, then 2 tabs x 3 days, then 1.5 tabs x 3 days, then 1 tab x 3 days, then 0.5 tabs x 3 days 27 tablet 0  . triamcinolone cream (KENALOG) 0.1 % APPLY TO AFFECTED AREA UP TO TWICE A DAY AS NEEDED (NOT TO FACE, GROIN, OR UNDERARMS)  3  . dexamethasone (DECADRON) 4 MG tablet Take 2 tablets (8 mg total) by mouth daily. Start the day after chemotherapy for 2 days. Take with food. 30 tablet 1  . lidocaine-prilocaine (EMLA) cream Apply to affected area once 30 g 3  . ondansetron (ZOFRAN) 8 MG tablet Take 1 tablet (8 mg total) by mouth 2 (two) times daily as needed for refractory  nausea / vomiting. Start on day 3 after chemotherapy. 30 tablet 1  . prochlorperazine (COMPAZINE) 10 MG tablet Take 1 tablet (10 mg total) by mouth every 6 (six) hours as needed (Nausea or vomiting). 30 tablet 1   Current Facility-Administered Medications  Medication Dose Route Frequency Provider Last Rate Last Dose  . sodium chloride flush (NS) 0.9 % injection 10 mL  10 mL Intravenous PRN Twana First, MD   10 mL at 09/05/16 1415   Review of Systems  Constitutional: Negative.  Negative for weight loss.       Good appetite   HENT: Negative.   Eyes: Negative.   Respiratory: Negative.  Negative for shortness of breath.   Cardiovascular: Negative for chest pain and leg swelling.  Gastrointestinal: Negative for abdominal pain.  Genitourinary: Negative.   Musculoskeletal: Negative.   Skin: Positive for rash (whole body, improving).  Neurological: Positive for tingling (hands and feet, severe). Negative for headaches.  Endo/Heme/Allergies: Negative.   Psychiatric/Behavioral: Negative.   All other systems reviewed and are negative. 14 point ROS was done and is otherwise as detailed above or in HPI  PHYSICAL EXAMINATION: ECOG PERFORMANCE STATUS: 1 - Symptomatic but completely ambulatory  Vitals:   09/05/16 1356  BP: 132/81  Pulse: 85  Resp: 18  Temp: 98.2 F (36.8 C)   Filed Weights   09/05/16 1356  Weight: 195 lb (88.5 kg)   Physical Exam  Constitutional: He is oriented to person, place, and time and well-developed, well-nourished, and in no distress.  HENT:  Head: Normocephalic and atraumatic.  Mouth/Throat: Oropharynx is clear and moist.  Eyes: Pupils are equal, round, and reactive to light. Conjunctivae and EOM are normal.  Neck: Normal range of motion. Neck supple.  Cardiovascular: Normal rate, regular rhythm and normal heart sounds.  Exam reveals no gallop and no friction rub.   No murmur heard. Pulmonary/Chest: Effort normal and breath sounds normal. No respiratory  distress. He has no wheezes. He has no rales. He exhibits no tenderness.  Abdominal: Soft. Bowel sounds are normal. He exhibits no distension. There is no tenderness. There is no rebound and no guarding.  2 large abdominal hernias, nontender, large surgical scars well healed  Musculoskeletal: Normal range of motion. He exhibits no edema.  Neurological: He is alert and oriented to person, place, and time. Gait normal.  Skin: Skin is  warm and dry. No rash noted. No erythema.  Diffuse erythematous rash on arms and back  Nursing note and vitals reviewed.  LABORATORY DATA:  I have reviewed the data as listed Lab Results  Component Value Date   WBC 8.6 06/21/2016   HGB 14.2 06/21/2016   HCT 41.0 06/21/2016   MCV 102.8 (H) 06/21/2016   PLT 195 06/21/2016   CMP     Component Value Date/Time   NA 138 06/01/2016 1153   K 3.4 (L) 06/01/2016 1153   CL 103 06/01/2016 1153   CO2 26 06/01/2016 1153   GLUCOSE 94 06/01/2016 1153   BUN 5 (L) 06/01/2016 1153   CREATININE 0.56 (L) 06/01/2016 1153   CALCIUM 9.0 06/01/2016 1153   PROT 6.9 05/30/2016 1033   ALBUMIN 3.4 (L) 05/30/2016 1033   AST 29 05/30/2016 1033   ALT 23 05/30/2016 1033   ALKPHOS 111 05/30/2016 1033   BILITOT 0.5 05/30/2016 1033   GFRNONAA >60 06/01/2016 1153   GFRAA >60 06/01/2016 1153   PATHOLOGY: 06/21/16 Soft Tissue Needle Core Biopsy, Right lower quadrant peritoneal mass - ADENOCARCINOMA, CONSISTENT WITH COLONIC PRIMARY.  RADIOGRAPHIC STUDIES: I have personally reviewed the radiological images as listed and agreed with the findings in the report.  CT Abdomen 03/04/2016 IMPRESSION: 1. Findings highly concerning for disease progression with interval increase in the soft tissue mass within the right lower quadrant.  2. Apparent thickening of the rectum as can be seen in the setting of proctitis.  3. Hepatic stenosis.   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 Chronic diarrhea following colectomy. Diffuse contact dermatitis due to allergy from detergent Right foot swelling  PLAN: - Dr. Crisoforo Oxford has recommended for patient to receive about 6-8 cycles of chemotherapy with restaging prior to him resecting his retroperitoneal mass.  - I have discussed chemotherapy with FOLFOX with the patient. May add avastin if he is not having a good response after 4 cycles of chemo. Plan to start him next week. - RTC in 2 weeks for follow up to assess tolerability with FOLFOX.  ORDERS PLACED FOR THIS ENCOUNTER: Orders Placed This Encounter  Procedures  . CBC with Differential    Standing Status:   Standing    Number of Occurrences:   20    Standing Expiration Date:   09/06/2017  . Comprehensive metabolic panel    Standing Status:   Standing    Number of Occurrences:   20    Standing Expiration Date:   09/06/2017  . CBC with Differential [facility])    Standing Status:   Standing    Number of Occurrences:   20    Standing Expiration Date:   09/06/2017  . Comprehensive metabolic panel    Standing Status:   Standing    Number of Occurrences:   20    Standing Expiration Date:   09/06/2017  . CBC with Differential    Standing Status:   Standing    Number of Occurrences:   20    Standing Expiration Date:   09/06/2017  . Comprehensive metabolic panel    Standing Status:   Standing    Number of Occurrences:   20    Standing Expiration Date:   09/06/2017    All questions were answered. The  patient knows to call the clinic with any problems, questions or concerns.   This note was electronically signed by:   Twana First, MD 09/05/2016 3:58 PM

## 2016-09-05 NOTE — Progress Notes (Signed)
Louann Liv presented for Portacath access and flush.  Proper placement of portacath confirmed by CXR.  Portacath located right chest wall accessed with  H 20 needle.  Good blood return present. Portacath flushed with 5ml NS and 500U/59ml Heparin and needle removed intact.  Procedure tolerated well and without incident.  Discharged ambulatory.

## 2016-09-06 ENCOUNTER — Encounter (HOSPITAL_COMMUNITY): Payer: Self-pay | Admitting: Emergency Medicine

## 2016-09-06 LAB — CEA: CEA1: 6.1 ng/mL — AB (ref 0.0–4.7)

## 2016-09-06 NOTE — Progress Notes (Signed)
Chemotherapy teaching pulled together and appts made. 

## 2016-09-06 NOTE — Patient Instructions (Signed)
[place]   CHEMOTHERAPY INSTRUCTIONS  You have stage 4 colon cancer.  Dr Crisoforo Oxford wants to treat you with 6-8 cycles of FOLFOX before he sees you for your re-section.  1 cycle is every 2 weeks.   You will have a pump that you take home home for 46 hours.  This treatment is with palliative intent, which means you are treatable but not curable.  You will see the doctor regularly throughout treatment.  We monitor your lab work prior to every treatment.  The doctor monitors your response to treatment by the way you are feeling, your blood work, and scans periodically.  You will experience wait times while you are here for treatment.  It will take about 30 minutes to 1 hour for your lab work to result.  There will be wait times while the pharmacy mixes your medications.    You will have pre-medications that you receive prior to each chemotherapy: Premeds: Aloxi - high powered nausea/vomiting prevention medication used for chemotherapy patients.  Dexamethasone - steroid - given to reduce the risk of you having an allergic type reaction to the chemotherapy. Dex can cause you to feel energized, nervous/anxious/jittery, make you have trouble sleeping, and/or make you feel hot/flushed in the face/neck and/or look pink/red in the face/neck. These side effects will pass as the Dex wears off. (takes 20 minutes to infuse)   POTENTIAL SIDE EFFECTS OF TREATMENT:  5-Fluorouracil (Adrucil)  About This Drug Fluorouracil is used to treat cancer. It is given in the vein (IV).  This is the drug will will take home in the pump.  Possible Side Effects . Hair loss. Hair loss is often temporary, although with certain medicine, hair loss can sometimes be permanent. Hair loss may happen suddenly or gradually. If you lose hair, you may lose it from your head, face, armpits, pubic area, chest, and/or legs. You may also notice your hair getting thin. . Changes in your nail color, nail loss  and/or brittle nail . Darkening of the skin, or changes to the color of your skin and/or veins used for infusion . Rash, itching . Nausea and throwing up (vomiting) . Loose bowel movements (diarrhea) . Ulcers - sores that may cause pain or bleeding in your digestive tract, which includes your mouth, esophagus, stomach, small/large intestines and rectum . Soreness of the mouth and throat. You may have red areas, white patches, or sores that hurt. . Decreased appetite (decreased hunger) . Changes in the tissue of the heart and/or heart attack. Some changes may happen that can cause your heart to have less ability to pump blood. . Bone marrow depression. This is a decrease in the number of white blood cells, red blood cells, and platelets. This may raise your risk of infection, make you tired and weak (fatigue), and raise your risk of bleeding . Sensitivity to light (photosensitivity). Photosensitivity means that you may become more sensitive to the sun and/or light. Your eyes may water more, mostly in bright light. . Allergic reaction: Allergic reactions, including anaphylaxis are rare but may happen in some patients. Signs of allergic reaction to this drug may be swelling of the face, feeling like your tongue or throat are swelling, trouble breathing, rash, itching, fever, chills, feeling dizzy, and/or feeling that your heart is beating in a fast or not normal way. If this happens, do not take another dose of this drug. You should get urgent medical treatment. . Blurred vision or other changes  in eyesight Note: Not all possible side effects are included above.  Warnings and Precautions . Hand-and-foot syndrome. The palms of your hands or soles of your feet may tingle, become numb, painful, swollen, or red. . Changes in your central nervous system can happen. The central nervous system is made up of your brain and spinal cord. You could feel extreme tiredness, agitation, confusion,  hallucinations (see or hear things that are not there), trouble understanding or speaking, loss of control of your bowels or bladder, eyesight changes, numbness or lack of strength to your arms, legs, face, or body, and coma. If you start to have any of these symptoms let your doctor know right away. . Side effects of this drug may be unexpectedly severe in some patients Note: Some of the side effects above are very rare. If you have concerns and/or questions, please discuss them with your medical team.  Important Information . This drug may be present in the saliva, tears, sweat, urine, stool, vomit, semen, and vaginal secretions. Talk to your doctor and/or your nurse about the necessary precautions to take during this time.  Treating Side Effects . To help with hair loss, wash with a mild shampoo and avoid washing your hair every day. . Avoid rubbing your scalp, pat your hair or scalp dry. . Avoid coloring your hair. . Limit your use of hair spray, electric curlers, blow dryers, and curling irons. . If you are interested in getting a wig, talk to your nurse. You can also call the [facility] at [phone] to find out information about the "Look Good, Feel Better" program close to where you live. It is a free program where women getting chemotherapy can learn about wigs, turbans and scarves as well as makeup techniques and skin and nail care. Marland Kitchen Keeping your nails moisturized may help with brittleness. . To help with itching, moisturize your skin several times day. . Avoid sun exposure and apply sunscreen routinely when outdoors. . If you get a rash do not put anything on it unless your doctor or nurse says you may. Keep the area around the rash clean and dry. Ask your doctor for medicine if your rash bothers you. . To help with decreased appetite, eat small, frequent meals. . Eat high caloric food such as pudding, ice cream, yogurt and milkshakes. . Drink plenty of fluids  (a minimum of eight glasses per day is recommended). . If you throw up or have loose bowel movements, you should drink more fluids so that you do not become dehydrated (lack water in the body from losing too much fluid). . To help with nausea and vomiting, eat small, frequent meals instead of three large meals a day. Choose foods and drinks that are at room temperature. Ask your nurse or doctor about other helpful tips and medicine that is available to help or stop lessen these symptoms. . If you get diarrhea, eat low-fiber foods that are high in protein and calories and avoid foods that can irritate your digestive tracts or lead to cramping. . Ask your nurse or doctor about medicine that can lessen or stop your diarrhea. . Mouth care is very important. Your mouth care should consist of routine, gentle cleaning of your teeth or dentures and rinsing your mouth with a mixture of 1/2 teaspoon of salt in 8 ounces of water or  teaspoon of baking soda in 8 ounces of water. This should be done at least after each meal and at bedtime. . If  you have mouth sores, avoid mouthwash that has alcohol. Also avoid alcohol and smoking because they can bother your mouth and throat. . Manage tiredness by pacing your activities for the day. . Be sure to include periods of rest between energy-draining activities. . To help decrease your risk of infections, wash your hands regularly. . Avoid close contact with people who have a cold, the flu, or other infections. . Use a soft toothbrush. Check with your nurse before using dental floss. . Be very careful when using knives or tools. . Use an electric shaver instead of a razor.  Food and Drug Interactions . There are no known interactions of fluorouracil with food. . Check with your doctor or pharmacist about all other prescription medicines and dietary supplements you are taking before starting this medicine as there are a lot of known drug interactions with  fluorouracil. Also, check with your doctor or pharmacist before starting any new prescription or over-the-counter medicines, or dietary supplement to make sure that there are no interactions.  When to Call the Doctor Call your doctor or nurse if you have any of these symptoms and/or any new or unusual symptoms: . Fever of 100.5 F (38 C) or higher . Chills . Easy bleeding or bruising . Trouble breathing . Feeling dizzy or lightheaded . Feeling that your heart is beating in a fast or not normal way (palpitations) . Chest pain or symptoms of a heart attack. Most heart attacks involve pain in the center of the chest that lasts more than a few minutes. The pain may go away and come back or it can be constant. It can feel like pressure, squeezing, fullness, or pain. Sometimes pain is felt in one or both arms, the back, neck, jaw, or stomach. If any of these symptoms last 2 minutes, call 911. Marland Kitchen Confusion and/or agitation . Hallucinations . Trouble understanding or speaking . Blurry vision or changes in your eyesight . Numbness or lack of strength to your arms, legs, face, or body . Nausea that stops you from eating or drinking and/or is not relieved by prescribed medicines . Throwing up more than 3 times a day . Loose bowel movements (diarrhea) 4 times a day or loose bowel movements with lack of strength or a feeling of being dizzy . Lasting loss of appetite or rapid weight loss of five pounds in a week . Pain in your mouth or throat that makes it hard to eat or drink . Pain along the digestive tract - especially if worse after eating . Blood in your vomit (bright red or coffee-ground) and/or stools (bright red, or black/tarry) . Coughing up blood . Fatigue that interferes with your daily activities . Painful, red, or swollen areas on your hands or feet . Numbness and/or tingling of your hands and/or feet . Signs of allergic reaction: swelling of the face, feeling like your tongue or throat  are swelling, trouble breathing, rash, itching, fever, chills, feeling dizzy, and/or feeling that your heart is beating in a fast or not normal way . If you think you are pregnant or may have impregnated your partner  Reproduction Warnings . Pregnancy warning: This drug may have harmful effects on the unborn baby. Women of child bearing potential should use effective methods of birth control during your cancer treatment. Let your doctor know right away if you think you may be pregnant. . Breastfeeding warning: It is not known if this drug passes into breast milk. For this reason, women should talk  to their doctor about the risks and benefits of breast feeding during treatment with this drug because this drug may enter the breast milk and cause harm to a breast feeding baby. . Fertility warning: In men and women both, this drug may affect your ability to have children in the future. Talk with your doctor or nurse if you plan to have children. Ask for information on sperm or egg banking.   Leucovorin Calcium (Generic Name) Other Names: Leucovorin, Wellcovorin, Folinic Acid, Citrovorum Factor  About This Drug Leucovorin is a vitamin. It is used in combination with other cancer fighting drugs such as 5-fluorouracil and methotrexate. Leucovorin helps 5-fluorouracil kill the cancer cells. It also helps to reduce the side effects of methotrexate. Leucovorin is given in the vein (IV) or by mouth (orally).  Will take 2 hours to infuse.  Possible Side Effects . Rash . Wheezing Leucovorin by itself has very few side effects. Side effects you may have can be caused by the other drugs you are taking, such as 5-fluorouracil or methotrexate.  Allergic Reactions . Allergic reactions to this drug are rare. While you are getting this drug by in your vein (IV), please tell your nurse right away if you have any of these symptoms of an allergic reaction: . Trouble catching your breath . Feeling like  your tongue or throat are swelling . Feeling your heart beat quickly or in a not normal way (palpitations) . Feeling dizzy or lightheaded . Flushing, itching, rash, and/or hives . If you are taking the oral form of leucovorin and you have these symptoms, do not take another dose of this drug and get urgent medical treatment.  Treating Side Effects If you get a rash do not put anything on it unless your doctor or nurse says you may. Keep the area around the rash clean and dry. Ask your doctor for medicine if your rash bothers you.  Food and Drug Interactions There are no known interactions of leucovorin with food. This drug may interact with other medicines. Tell your doctor and pharmacist about all the medicines and dietary supplements (vitamins, minerals, herbs and others) that you are taking at this time. The safety and use of dietary supplements and alternative diets are often not known. Using these might affect your cancer or interfere with your treatment. Until more is known, you should not use dietary supplements or alternative diets without your cancer doctor's help.  When to Call the Doctor . Call your doctor or nurse right away if you have any of these symptoms: . Wheezing or trouble breathing . Rash with or without itching, redness, or hives . If you take leucovorin by mouth, please also call your doctor right away if you have: Marland Kitchen Throwing up (vomiting) . Loose bowel movements (diarrhea)  Reproduction Concerns . Pregnancy warning: It is not known if this drug may harm an unborn child. For this reason, be sure to talk with your doctor if you are pregnant or planning to become pregnant while getting this drug. . Genetic counseling is available for you to talk about the effects of this drug therapy on future pregnancies. Also, a genetic counselor can look at the possible risk of problems in the unborn baby due to this medicine if an exposure happens during pregnancy. . Breast  feeding warning: It is not known if this drug passes into breast milk. For this reason, women should talk to their doctor about the risks and benefits of breast feeding during treatment with this  drug because this drug may enter the breast milk and badly harm a breast feeding baby.    Oxaliplatin (Generic Name) Other Names: EloxatinTM  About This Drug Oxaliplatin is used to treat cancer. It is given in the vein (IV).  Will take 2 hours to infuse and will infuse with the leucovorin.    Possible Side Effects (More Common) . Effects on the nerves are called peripheral neuropathy. You may feel numbness, tingling, or pain in your hands and feet. Cold temperatures can make it worse. Avoid cold drinks with ice. Dress warmly and cover the skin if you go out in the cold. Wear socks or gloves if you have to touch cold objects like cold flooring or items in the refrigerator or freezer. . It may be hard for you to button your clothes, open jars, or walk as usual. The effect on the nerves may get worse with more doses of the drug. These effects get better in some people after the drug is stopped but it does not get better in all people . Bone marrow depression: This is a decrease in the number of white blood cells, red blood cells, and platelets. It may increase your risk for infection, make you tired and weak (fatigue), and raise your risk of bleeding. . Nausea and throwing up (vomiting). These symptoms may happen within a few hours after your treatment and may last up to 72 hours. Medicines are available to stop or lessen these side effects. . Electrolyte changes. Your blood will be checked for electrolyte changes as needed.  Possible Side Effects (Less Common) . Skin and tissue irritation: This may involve redness, pain, warmth, or swelling at the IV site. This happens if the drug leaks out of the vein and into nearby tissue. . Serious allergic reactions including anaphylaxis are rare. While you  are getting this drug in your vein (IV), tell your nurse right away if you have any of these symptoms of an allergic reaction: . Trouble catching your breath. . Feeling like your tongue or throat are swelling. . Feeling your heart beat quickly or in a not normal way (palpitations). . Feeling dizzy or lightheaded. . Flushing, itching, rash, or hives. . Changes in your liver function. Your doctor will check your liver function as needed.  Treating Side Effects Oxaliplatin (Generic Name) continued Page 12 of 14 . Do not drink cold drinks or use ice cubes in drinks. Drink fluids at room temperature or warmer. Drink through a straw. . Wear gloves to touch cold objects. Be aware that most metals are cold to the touch, especially in winter. Examples are your car door handle and your mail box latch. . Wear warm clothing in cold weather at all times. Cover your mouth and nose with a scarf or a pulldown cap (ski cap) to warm the air that goes to your lungs. . Ask your doctor or nurse about medicine to treat nausea, throwing up, headache, or loose bowel movements. . While you are getting this drug in your vein, tell your nurse right away if you have any pain, redness, or swelling at the site of the IV infusion. . Mouth care is very important. Your mouth care should consist of routine, gentle cleaning of your teeth or dentures and rinsing your mouth with a mixture of 1/2 teaspoon of salt in 8 ounces of water or  teaspoon of baking soda in 8 ounces of water. This should be done at least after each meal and at bedtime. Marland Kitchen  Drink 6-8 cups of fluids each day unless your doctor has told you to limit your fluid intake due to some other health problem. A cup is 8 ounces of fluid. If you throw up or have loose bowel movements you should drink more fluids so that you do not become dehydrated (lack water in the body due to losing too much fluid).  Food and Drug Interactions There are no known interactions of  oxaliplatin with food. This drug may interact with other medicines. Tell your doctor and pharmacist about all the medicines and dietary supplements (vitamins, minerals, herbs and others) that you are taking at this time. The safety and use of dietary supplements and alternative diets are often not known. Using these might affect your cancer or interfere with your treatment. Until more is known, you should not use dietary supplements or alternative diets without your cancer doctor's help.  When to Call the Doctor Call your doctor or nurse right away if you have any of these symptoms: . Fever of 100.5 F (38 C) or above . Chills . Easy bruising or bleeding . Wheezing or trouble breathing . Rash or itching . Feeling dizzy or lightheaded . Feeling that your heart is beating in a fast or not normal way (palpitations) . Loose bowel movements (diarrhea) more than 4 times a day or diarrhea with weakness or feeling lightheaded . Blurred vision or other changes in eyesight . Pain when passing urine; blood in urine . Pain in your lower back or side . Feeling confused or agitated . Nausea that stops you from eating or drinking . Throwing up more than 3 times a day . Chest pain or symptoms of a heart attack. Most heart attacks involve pain in the center of the chest that lasts more than a few minutes. The pain may go away and come back. It can feel like pressure, squeezing, fullness, or pain. Sometimes pain is felt in one or both arms, the back, neck, jaw, or stomach. If any of these symptoms last 2 minutes, call 911. Marland Kitchen Symptoms of a stroke such as sudden numbness or weakness of your face, arm, or leg, mostly on one side of your body; sudden confusion, trouble speaking or understanding; sudden trouble seeing in one or both eyes; sudden trouble walking, feeling dizzy, loss of balance or coordination; or sudden, bad headache with no known cause. If you have any of these symptoms for 2 minutes,  call 911. . Signs of liver problems: dark urine, pale bowel movements, bad stomach pain, feeling very tired and weak, itching, or yellowing of the eyes or skin. Call your doctor or nurse as soon as possible if any of these symptoms happen: . Change in hearing, ringing in the ears . Decreased urine . Unusual thirst or passing urine often . Pain in your mouth or throat that makes it hard to eat or drink . Nausea that is not relieved by prescribed medicines . Rash that is not relieved by prescribed medicines . Heavy menstrual period that lasts longer than normal . Numbness, tingling, decreased feeling or weakness in fingers, toes, arms, or legs . Trouble walking or changes in the way you walk, feeling clumsy when buttoning clothes, opening jars, or other routine hand motions . Swelling of legs, ankles, or feet . Weight gain of 5 pounds in one week (fluid retention) . Lasting loss of appetite or rapid weight loss of five pounds in a week . Fatigue that interferes with your daily activities . Headache that  does not go away . Painful, red, or swollen areas on your hands or feet. . No bowel movement for 3 days or you feel uncomfortable . Extreme weakness that interferes with normal activities  Reproduction Concerns Infertility Warning: Sexual problems and reproduction concerns may happen. In both men and women, this drug may affect your ability to have children. This cannot be determined before your treatment. Talk with your doctor or nurse if you plan to have children. Ask for information on sperm or egg banking. In men, this drug may interfere with your ability to make sperm, but it should not change your ability to have sexual relations. In women, menstrual bleeding may become irregular or stop while you are getting this drug. Do not assume that you cannot become pregnant if you do not have a menstrual period. Women may go through signs of menopause (change of life) like vaginal dryness  or itching. Vaginal lubricants can be used to lessen vaginal dryness, itching, and pain during sexual relations. Genetic counseling is available for you to talk about the effects of this drug therapy on future pregnancies. Also, a genetic counselor can look at the possible risk of problems in the unborn baby due to this medicine if an exposure happens during pregnancy. Pregnancy warning: This drug may have harmful effects on the unborn child, so effective methods of birth control should be used during your cancer treatment. Breast feeding warning: It is not known if this drug passes into breast milk. For this reason, women should talk to their doctor about the risks and benefits of breast feeding during treatment with this drug because this drug may enter the breast milk and badly harm a breast feeding baby.   SELF CARE ACTIVITIES WHILE ON CHEMOTHERAPY: Hydration Increase your fluid intake 48 hours prior to treatment and drink at least 8 to 12 cups (64 ounces) of water/decaff beverages per day after treatment. You can still have your cup of coffee or soda but these beverages do not count as part of your 8 to 12 cups that you need to drink daily. No alcohol intake.  Medications Continue taking your normal prescription medication as prescribed.  If you start any new herbal or new supplements please let us know first to make sure it is safe.  Mouth Care Have teeth cleaned professionally before starting treatment. Keep dentures and partial plates clean. Use soft toothbrush and do not use mouthwashes that contain alcohol. Biotene is a good mouthwash that is available at most pharmacies or may be ordered by calling [phone]. Use warm salt water gargles (1 teaspoon salt per 1 quart warm water) before and after meals and at bedtime. Or you may rinse with 2 tablespoons of three-percent hydrogen peroxide mixed in eight ounces of water. If you are still having problems with your mouth or sores in your  mouth please call the clinic. If you need dental work, please let the doctor know before you go for your appointment so that we can coordinate the best possible time for you in regards to your chemo regimen. You need to also let your dentist know that you are actively taking chemo. We may need to do labs prior to your dental appointment.   Skin Care Always use sunscreen that has not expired and with SPF (Sun Protection Factor) of 50 or higher. Wear hats to protect your head from the sun. Remember to use sunscreen on your hands, ears, face, & feet.  Use good moisturizing lotions such as udder  cream, eucerin, or even Vaseline. Some chemotherapies can cause dry skin, color changes in your skin and nails.    . Avoid long, hot showers or baths. . Use gentle, fragrance-free soaps and laundry detergent. . Use moisturizers, preferably creams or ointments rather than lotions because the thicker consistency is better at preventing skin dehydration. Apply the cream or ointment within 15 minutes of showering. Reapply moisturizer at night, and moisturize your hands every time after you wash them.  Hair Loss (if your doctor says your hair will fall out)  . If your doctor says that your hair is likely to fall out, decide before you begin chemo whether you want to wear a wig. You may want to shop before treatment to match your hair color. . Hats, turbans, and scarves can also camouflage hair loss, although some people prefer to leave their heads uncovered. If you go bare-headed outdoors, be sure to use sunscreen on your scalp. . Cut your hair short. It eases the inconvenience of shedding lots of hair, but it also can reduce the emotional impact of watching your hair fall out. . Don't perm or color your hair during chemotherapy. Those chemical treatments are already damaging to hair and can enhance hair loss. Once your chemo treatments are done and your hair has grown back, it's OK to resume dyeing or perming  hair. With chemotherapy, hair loss is almost always temporary. But when it grows back, it may be a different color or texture. In older adults who still had hair color before chemotherapy, the new growth may be completely gray.  Often, new hair is very fine and soft.  Infection Prevention Please wash your hands for at least 30 seconds using warm soapy water. Handwashing is the #1 way to prevent the spread of germs. Stay away from sick people or people who are getting over a cold. If you develop respiratory systems such as green/yellow mucus production or productive cough or persistent cough let us know and we will see if you need an antibiotic. It is a good idea to keep a pair of gloves on when going into grocery stores/[employer] to decrease your risk of coming into contact with germs on the carts, etc. Carry alcohol hand gel with you at all times and use it frequently if out in public. If your temperature reaches 100.5 or higher please call the clinic and let us know.  If it is after hours or on the weekend please go to the ER if your temperature is over 100.5.  Please have your own personal thermometer at home to use.    Sex and bodily fluids If you are going to have sex, a condom must be used to protect the person that isn't taking chemotherapy. Chemo can decrease your libido (sex drive). For a few days after chemotherapy, chemotherapy can be excreted through your bodily fluids.  When using the toilet please close the lid and flush the toilet twice.  Do this for a few day after you have had chemotherapy.     Effects of chemotherapy on your sex life Some changes are simple and won't last long. They won't affect your sex life permanently. Sometimes you may feel: . too tired . not strong enough to be very active . sick or sore  . not in the mood . anxious or low Your anxiety might not seem related to sex. For example, you may be worried about the cancer and how your treatment is going. Or you may be  worried about money, or about how you family are coping with your illness. These things can cause stress, which can affect your interest in sex. It's important to talk to your partner about how you feel. Remember - the changes to your sex life don't usually last long. There's usually no medical reason to stop having sex during chemo. The drugs won't have any long term physical effects on your performance or enjoyment of sex. Cancer can't be passed on to your partner during sex  Contraception It's important to use reliable contraception during treatment. Avoid getting pregnant while you or your partner are having chemotherapy. This is because the drugs may harm the baby. Sometimes chemotherapy drugs can leave a man or woman infertile.  This means you would not be able to have children in the future. You might want to talk to someone about permanent infertility. It can be very difficult to learn that you may no longer be able to have children. Some people find counselling helpful. There might be ways to preserve your fertility, although this is easier for men than for women. You may want to speak to a fertility expert. You can talk about sperm banking or harvesting your eggs. You can also ask about other fertility options, such as donor eggs. If you have or have had breast cancer, your doctor might advise you not to take the contraceptive pill. This is because the hormones in it might affect the cancer.  It is not known for sure whether or not chemotherapy drugs can be passed on through semen or secretions from the vagina. Because of this some doctors advise people to use a barrier method if you have sex during treatment. This applies to vaginal, anal or oral sex. Generally, doctors advise a barrier method only for the time you are actually having the treatment and for about a week after your treatment. Advice like this can be worrying, but this does not mean that you have to avoid being intimate with your  partner. You can still have close contact with your partner and continue to enjoy sex.  Animals If you have cats or birds we just ask that you not change the litter or change the cage.  Please have someone else do this for you while you are on chemotherapy.   Food Safety During and After Cancer Treatment Food safety is important for people both during and after cancer treatment. Cancer and cancer treatments, such as chemotherapy, radiation therapy, and stem cell/bone marrow transplantation, often weaken the immune system. This makes it harder for your body to protect itself from foodborne illness, also called food poisoning. Foodborne illness is caused by eating food that contains harmful bacteria, parasites, or viruses.  Foods to avoid Some foods have a higher risk of becoming tainted with bacteria. These include: Marland Kitchen Unwashed fresh fruit and vegetables, especially leafy vegetables that can hide dirt and other contaminants . Raw sprouts, such as alfalfa sprouts . Raw or undercooked beef, especially ground beef, or other raw or undercooked meat and poultry . Fatty, fried, or spicy foods immediately before or after treatment.  These can sit heavy on your stomach and make you feel nauseous. . Raw or undercooked shellfish, such as oysters. . Sushi and sashimi, which often contain raw fish.  . Unpasteurized beverages, such as unpasteurized fruit juices, raw milk, raw yogurt, or cider . Undercooked eggs, such as soft boiled, over easy, and poached; raw, unpasteurized eggs; or foods made with raw egg, such as homemade raw  cookie dough and homemade mayonnaise Simple steps for food safety Shop smart. . Do not buy food stored or displayed in an unclean area. . Do not buy bruised or damaged fruits or vegetables. . Do not buy cans that have cracks, dents, or bulges. . Pick up foods that can spoil at the end of your shopping trip and store them in a cooler on the way home. Prepare and clean up foods  carefully. . Rinse all fresh fruits and vegetables under running water, and dry them with a clean towel or paper towel. . Clean the top of cans before opening them. . After preparing food, wash your hands for 20 seconds with hot water and soap. Pay special attention to areas between fingers and under nails. . Clean your utensils and dishes with hot water and soap. Marland Kitchen Disinfect your kitchen and cutting boards using 1 teaspoon of liquid, unscented bleach mixed into 1 quart of water.   Dispose of old food. . Eat canned and packaged food before its expiration date (the "use by" or "best before" date). . Consume refrigerated leftovers within 3 to 4 days. After that time, throw out the food. Even if the food does not smell or look spoiled, it still may be unsafe. Some bacteria, such as Listeria, can grow even on foods stored in the refrigerator if they are kept for too long. Take precautions when eating out. . At restaurants, avoid buffets and salad bars where food sits out for a long time and comes in contact with many people. Food can become contaminated when someone with a virus, often a norovirus, or another "bug" handles it. . Put any leftover food in a "to-go" container yourself, rather than having the server do it. And, refrigerate leftovers as soon as you get home. . Choose restaurants that are clean and that are willing to prepare your food as you order it cooked.    MEDICATIONS: Dexamethasone 4mg  tablet.  Take 2 tablets (8 mg total) by mouth daily. Start the day after chemotherapy for 2 days. Take with food.  Take with food.                                                                                                                                                               Zofran/Ondansetron 8mg  tablet. Take 1 tablet every 8 hours as needed for nausea/vomiting. (#1 nausea med to take, this can constipate)  Compazine/Prochlorperazine 10mg  tablet. Take 1 tablet every 6 hours as needed  for nausea/vomiting. (#2 nausea med to take, this can make you sleepy)   EMLA cream. Apply a quarter size amount to port site 1 hour prior to chemo. Do not rub in. Cover with plastic wrap.   Over-the-Counter Meds:  Miralax 1 capful in 8 oz of fluid  daily. May increase to two times a day if needed. This is a stool softener. If this doesn't work proceed you can add:  Senokot S-start with 1 tablet two times a day and increase to 4 tablets two times a day if needed. (total of 8 tablets in a 24 hour period). This is a stimulant laxative.   Call us if this does not help your bowels move.   Imodium 2mg  capsule. Take 2 capsules after the 1st loose stool and then 1 capsule every 2 hours until you go a total of 12 hours without having a loose stool. Call the [facility] if loose stools continue. If diarrhea occurs @ bedtime, take 2 capsules @ bedtime. Then take 2 capsules every 4 hours until morning. Call [facility].     Constipation Sheet *Miralax in 8 oz of fluid daily.  May increase to two times a day if needed.  This is a stool softener.  If this not enough to keep your bowel regular:  You can add:  *Senokot S, start with one tablet twice a day and can increase to 4 tablets twice a day if needed.  This is a stimulant laxative.   Sometimes when you take pain medication you need BOTH a medicine to keep your stool soft and a medicine to help your bowel push it out!  Please call if the above does not work for you.   Do not go more than 2 days without a bowel movement.  It is very important that you do not become constipated.  It will make you feel sick to your stomach (nausea) and can cause abdominal pain and vomiting.    Diarrhea Sheet  If you are having loose stools/diarrhea, please purchase Imodium and begin taking as outlined:  At the first sign of poorly formed or loose stools you should begin taking Imodium(loperamide) 2 mg capsules.  Take two caplets (4mg ) followed by one caplet  (2mg ) every 2 hours until you have had no diarrhea for 12 hours.  During the night take two caplets (4mg ) at bedtime and continue every 4 hours during the night until the morning.  Stop taking Imodium only after there is no sign of diarrhea for 12 hours.    Always call the [facility] if you are having loose stools/diarrhea that you can't get under control.  Loose stools/disrrhea leads to dehydration (loss of water) in your body.  We have other options of trying to get the loose stools/diarrhea to stopped but you must let us know!    Nausea Sheet  Zofran/Ondansetron 8mg  tablet. Take 1 tablet every 8 hours as needed for nausea/vomiting. (#1 nausea med to take, this can constipate)  Compazine/Prochlorperazine 10mg  tablet. Take 1 tablet every 6 hours as needed for nausea/vomiting. (#2 nausea med to take, this can make you sleepy)  You can take these medications together or separately.  We would first like for you to try the Ondansetron by itself and then take the Prochloperizine if needed. But you are allowed to take both medications at the same time if your nausea is that severe.  If you are having persistent nausea (nausea that does not stop) please take these medications on a staggered schedule so that the nausea medication stays in your body.  Please call the [facility] and let us know the amount of nausea that you are experiencing.  If you begin to vomit, you need to call the [facility] and if it is the weekend and you  have vomited more than one time and cant get it to stop-go to the Emergency Room.  Persistent nausea/vomiting can lead to dehydration (loss of fluid in your body) and will make you feel terrible.   Ice chips, sips of clear liquids, foods that are @ room temperature, crackers, and toast tend to be better tolerated.     SYMPTOMS TO REPORT AS SOON AS POSSIBLE AFTER TREATMENT:  FEVER GREATER THAN 100.5 F  CHILLS WITH OR WITHOUT FEVER  NAUSEA AND VOMITING THAT IS NOT  CONTROLLED WITH YOUR NAUSEA MEDICATION  UNUSUAL SHORTNESS OF BREATH  UNUSUAL BRUISING OR BLEEDING  TENDERNESS IN MOUTH AND THROAT WITH OR WITHOUT PRESENCE OF ULCERS  URINARY PROBLEMS  BOWEL PROBLEMS  UNUSUAL RASH    Wear comfortable clothing and clothing appropriate for easy access to any Portacath or PICC line. Let us know if there is anything that we can do to make your therapy better!     What to do if you need assistance after hours or on the weekends: CALL [phone].  HOLD on the line, do not hang up.  You will hear multiple messages but at the end you will be connected with a nurse triage line.  They will contact the doctor if necessary.  Most of the time they will be able to assist you.  Do not call the hospital operator.       I have been informed and understand all of the instructions given to me and have received a copy. I have been instructed to call the clinic [phone] or my family physician as soon as possible for continued medical care, if indicated. I do not have any more questions at this time but understand that I may call the [facility] or the Patient Navigator at [phone] during office hours should I have questions or need assistance in obtaining follow-up care.

## 2016-09-11 ENCOUNTER — Encounter (HOSPITAL_BASED_OUTPATIENT_CLINIC_OR_DEPARTMENT_OTHER): Payer: BLUE CROSS/BLUE SHIELD

## 2016-09-11 ENCOUNTER — Encounter (HOSPITAL_COMMUNITY): Payer: Self-pay

## 2016-09-11 ENCOUNTER — Encounter (HOSPITAL_COMMUNITY): Payer: BLUE CROSS/BLUE SHIELD

## 2016-09-11 VITALS — BP 124/72 | HR 88 | Temp 98.0°F | Resp 18 | Ht 71.5 in | Wt 203.2 lb

## 2016-09-11 DIAGNOSIS — C182 Malignant neoplasm of ascending colon: Secondary | ICD-10-CM

## 2016-09-11 DIAGNOSIS — Z5111 Encounter for antineoplastic chemotherapy: Secondary | ICD-10-CM | POA: Diagnosis not present

## 2016-09-11 DIAGNOSIS — L299 Pruritus, unspecified: Secondary | ICD-10-CM

## 2016-09-11 MED ORDER — DEXTROSE 5 % IV SOLN
Freq: Once | INTRAVENOUS | Status: AC
  Administered 2016-09-11: 09:00:00 via INTRAVENOUS

## 2016-09-11 MED ORDER — PALONOSETRON HCL INJECTION 0.25 MG/5ML
0.2500 mg | Freq: Once | INTRAVENOUS | Status: AC
  Administered 2016-09-11: 0.25 mg via INTRAVENOUS
  Filled 2016-09-11: qty 5

## 2016-09-11 MED ORDER — SODIUM CHLORIDE 0.9 % IV SOLN: 10.0000 mg | Freq: Once | INTRAVENOUS | Status: DC

## 2016-09-11 MED ORDER — DIPHENHYDRAMINE HCL 50 MG/ML IJ SOLN
INTRAMUSCULAR | Status: AC
  Filled 2016-09-11: qty 1

## 2016-09-11 MED ORDER — FLUOROURACIL CHEMO INJECTION 5 GM/100ML
2375.0000 mg/m2 | INTRAVENOUS | Status: DC
  Administered 2016-09-11: 5000 mg via INTRAVENOUS
  Filled 2016-09-11: qty 100

## 2016-09-11 MED ORDER — DEXTROSE 5 % IV SOLN
403.0000 mg/m2 | Freq: Once | INTRAVENOUS | Status: AC
  Administered 2016-09-11: 850 mg via INTRAVENOUS
  Filled 2016-09-11: qty 35

## 2016-09-11 MED ORDER — DIPHENHYDRAMINE HCL 50 MG/ML IJ SOLN
25.0000 mg | Freq: Once | INTRAMUSCULAR | Status: AC
  Administered 2016-09-11: 25 mg via INTRAVENOUS

## 2016-09-11 MED ORDER — DEXAMETHASONE SODIUM PHOSPHATE 10 MG/ML IJ SOLN
10.0000 mg | Freq: Once | INTRAMUSCULAR | Status: AC
  Administered 2016-09-11: 10 mg via INTRAVENOUS
  Filled 2016-09-11: qty 1

## 2016-09-11 MED ORDER — SODIUM CHLORIDE 0.9% FLUSH
10.0000 mL | INTRAVENOUS | Status: DC | PRN
  Administered 2016-09-11: 10 mL
  Filled 2016-09-11: qty 10

## 2016-09-11 MED ORDER — OXALIPLATIN CHEMO INJECTION 100 MG/20ML
85.0000 mg/m2 | Freq: Once | INTRAVENOUS | Status: AC
  Administered 2016-09-11: 180 mg via INTRAVENOUS
  Filled 2016-09-11: qty 20

## 2016-09-11 MED ORDER — FLUOROURACIL CHEMO INJECTION 2.5 GM/50ML
400.0000 mg/m2 | Freq: Once | INTRAVENOUS | Status: AC
  Administered 2016-09-11: 850 mg via INTRAVENOUS
  Filled 2016-09-11 (×2): qty 17

## 2016-09-11 NOTE — Patient Instructions (Signed)
[facility] Discharge Instructions for Patients Receiving Chemotherapy  Today you received the following chemotherapy agents Oxaliplatin, leucovorin, and 5FU.   Follow up as scheduled and call for any questions.   To help prevent nausea and vomiting after your treatment, we encourage you to take your nausea medication.   If you develop nausea and vomiting that is not controlled by your nausea medication, call the clinic.   BELOW ARE SYMPTOMS THAT SHOULD BE REPORTED IMMEDIATELY:  *FEVER GREATER THAN 100.5 F  *CHILLS WITH OR WITHOUT FEVER  NAUSEA AND VOMITING THAT IS NOT CONTROLLED WITH YOUR NAUSEA MEDICATION  *UNUSUAL SHORTNESS OF BREATH  *UNUSUAL BRUISING OR BLEEDING  TENDERNESS IN MOUTH AND THROAT WITH OR WITHOUT PRESENCE OF ULCERS  *URINARY PROBLEMS  *BOWEL PROBLEMS  UNUSUAL RASH Items with * indicate a potential emergency and should be followed up as soon as possible.  Feel free to call the clinic you have any questions or concerns. The clinic phone number is [phone].  Please show the [facility] at check-in to the Emergency Department and triage nurse.  [place] at [facility]  Discharge Instructions:  Pt given discharge instructions on 5FU pump with phone number to call for any problems _______________________________________________________________  Thank you for choosing [place] [facility] at [facility] to provide your oncology and hematology care.  To afford each patient quality time with our providers, please arrive at least 15 minutes before your scheduled appointment.  You need to re-schedule your appointment if you arrive 10 or more minutes late.  We strive to give you quality time with our providers, and arriving late affects you and other patients whose appointments are after yours.  Also, if you no show three or more times for appointments you may be dismissed from the clinic.  Again, thank  you for choosing [place] [facility] [place] at [facility] hope is that these requests will allow you access to exceptional care and in a timely manner. _______________________________________________________________  If you have questions after your visit, please contact our office at [phone] between the hours of 8:30 a.m. and 5:00 p.m. Voicemails left after 4:30 p.m. will not be returned until the following business day. _______________________________________________________________  For prescription refill requests, have your pharmacy contact our office. _______________________________________________________________  Recommendations made by the consultant and any test results will be sent to your referring physician. _______________________________________________________________

## 2016-09-11 NOTE — Progress Notes (Signed)
Edward Graham tolerated chemo tx well without issues Benadryl was given for c/o itching prior to starting his chemotherpy and was effective. After chemo infusion was completed and pt was flushing with NS infusion, the port needle was dislodged as we were trying to change his port dressing which would not stick properly to his skin. Portacath was re-accessed without difficulty and new  Dressing was applied.Pt viewed the video for pump teaching and given instructions including phone number to call and spill kit. Pt verbalized understanding VSS upon discharge. Pt discharged with 5FU pump infusing without issues. Pt discharged self ambulatory in satisfactory condition accompanied by family member

## 2016-09-11 NOTE — Progress Notes (Signed)
Consent signed.  Chemotherapy teaching completed.  Extensive teaching packet given. 

## 2016-09-11 NOTE — Progress Notes (Signed)
Reviewed lab work from September 05, 2016 with Dr. Talbert Cage and ok to use these labs for treatment today.    1000-patient states he is itching due to his multiple sensitivities to allergens.  Asking for benadryl.  Reviewed with Dr. Talbert Cage with orders received.  Benadryl 25mg  IV.

## 2016-09-13 ENCOUNTER — Encounter (HOSPITAL_BASED_OUTPATIENT_CLINIC_OR_DEPARTMENT_OTHER): Payer: BLUE CROSS/BLUE SHIELD

## 2016-09-13 ENCOUNTER — Encounter (HOSPITAL_COMMUNITY): Payer: Self-pay

## 2016-09-13 VITALS — BP 123/73 | HR 101 | Temp 98.1°F | Resp 18

## 2016-09-13 DIAGNOSIS — C182 Malignant neoplasm of ascending colon: Secondary | ICD-10-CM

## 2016-09-13 MED ORDER — HEPARIN SOD (PORK) LOCK FLUSH 100 UNIT/ML IV SOLN
500.0000 [IU] | Freq: Once | INTRAVENOUS | Status: AC | PRN
  Administered 2016-09-13: 500 [IU]

## 2016-09-13 MED ORDER — SODIUM CHLORIDE 0.9% FLUSH
10.0000 mL | INTRAVENOUS | Status: DC | PRN
  Administered 2016-09-13: 10 mL
  Filled 2016-09-13: qty 10

## 2016-09-13 NOTE — Progress Notes (Signed)
Danyal B Gall presents to have home infusion pump d/c'd and for port-a-cath deaccess with flush.  Portacath located right chest wall accessed with  H 20 needle.  Good blood return present. Portacath flushed with NS and 500U/5ml Heparin, and needle removed intact.  Procedure tolerated well and without incident.  Discharged ambulatory.  

## 2016-09-14 ENCOUNTER — Telehealth (HOSPITAL_COMMUNITY): Payer: Self-pay

## 2016-09-14 NOTE — Telephone Encounter (Signed)
See telephone encounter note.

## 2016-09-20 ENCOUNTER — Encounter: Payer: Self-pay | Admitting: Pharmacist

## 2016-09-25 ENCOUNTER — Encounter (HOSPITAL_COMMUNITY): Payer: Self-pay

## 2016-09-25 ENCOUNTER — Encounter (HOSPITAL_BASED_OUTPATIENT_CLINIC_OR_DEPARTMENT_OTHER): Payer: BLUE CROSS/BLUE SHIELD | Admitting: Oncology

## 2016-09-25 ENCOUNTER — Encounter (HOSPITAL_BASED_OUTPATIENT_CLINIC_OR_DEPARTMENT_OTHER): Payer: BLUE CROSS/BLUE SHIELD

## 2016-09-25 VITALS — BP 132/80 | HR 65 | Temp 98.4°F | Resp 18 | Wt 198.0 lb

## 2016-09-25 DIAGNOSIS — R5383 Other fatigue: Secondary | ICD-10-CM | POA: Diagnosis not present

## 2016-09-25 DIAGNOSIS — C182 Malignant neoplasm of ascending colon: Secondary | ICD-10-CM | POA: Diagnosis not present

## 2016-09-25 DIAGNOSIS — G629 Polyneuropathy, unspecified: Secondary | ICD-10-CM | POA: Diagnosis not present

## 2016-09-25 DIAGNOSIS — M7989 Other specified soft tissue disorders: Secondary | ICD-10-CM

## 2016-09-25 DIAGNOSIS — K529 Noninfective gastroenteritis and colitis, unspecified: Secondary | ICD-10-CM

## 2016-09-25 DIAGNOSIS — R197 Diarrhea, unspecified: Secondary | ICD-10-CM | POA: Diagnosis not present

## 2016-09-25 DIAGNOSIS — R1900 Intra-abdominal and pelvic swelling, mass and lump, unspecified site: Secondary | ICD-10-CM

## 2016-09-25 DIAGNOSIS — Z5111 Encounter for antineoplastic chemotherapy: Secondary | ICD-10-CM

## 2016-09-25 DIAGNOSIS — Z720 Tobacco use: Secondary | ICD-10-CM

## 2016-09-25 LAB — COMPREHENSIVE METABOLIC PANEL WITH GFR
ALT: 30 U/L (ref 17–63)
AST: 30 U/L (ref 15–41)
Albumin: 3.7 g/dL (ref 3.5–5.0)
Alkaline Phosphatase: 59 U/L (ref 38–126)
Anion gap: 6 (ref 5–15)
BUN: 14 mg/dL (ref 6–20)
CO2: 25 mmol/L (ref 22–32)
Calcium: 8.8 mg/dL — ABNORMAL LOW (ref 8.9–10.3)
Chloride: 105 mmol/L (ref 101–111)
Creatinine, Ser: 0.73 mg/dL (ref 0.61–1.24)
GFR calc Af Amer: >60 mL/min
GFR calc non Af Amer: >60 mL/min
Glucose, Bld: 91 mg/dL (ref 65–99)
Potassium: 4.3 mmol/L (ref 3.5–5.1)
Sodium: 136 mmol/L (ref 135–145)
Total Bilirubin: 0.5 mg/dL (ref 0.3–1.2)
Total Protein: 7.1 g/dL (ref 6.5–8.1)

## 2016-09-25 LAB — CBC WITH DIFFERENTIAL/PLATELET
BASOS ABS: 0.1 10*3/uL (ref 0.0–0.1)
BASOS PCT: 1 %
Eosinophils Absolute: 0.4 10*3/uL (ref 0.0–0.7)
Eosinophils Relative: 6 %
HEMATOCRIT: 39.8 % (ref 39.0–52.0)
Hemoglobin: 13.2 g/dL (ref 13.0–17.0)
Lymphocytes Relative: 23 %
Lymphs Abs: 1.6 10*3/uL (ref 0.7–4.0)
MCH: 33.4 pg (ref 26.0–34.0)
MCHC: 33.2 g/dL (ref 30.0–36.0)
MCV: 100.8 fL — ABNORMAL HIGH (ref 78.0–100.0)
MONO ABS: 0.8 10*3/uL (ref 0.1–1.0)
Monocytes Relative: 12 %
NEUTROS ABS: 4 10*3/uL (ref 1.7–7.7)
NEUTROS PCT: 58 %
Platelets: 263 10*3/uL (ref 150–400)
RBC: 3.95 MIL/uL — ABNORMAL LOW (ref 4.22–5.81)
RDW: 15.5 % (ref 11.5–15.5)
WBC: 6.9 10*3/uL (ref 4.0–10.5)

## 2016-09-25 MED ORDER — PALONOSETRON HCL INJECTION 0.25 MG/5ML
0.2500 mg | Freq: Once | INTRAVENOUS | Status: AC
  Administered 2016-09-25: 0.25 mg via INTRAVENOUS
  Filled 2016-09-25: qty 5

## 2016-09-25 MED ORDER — ALBUTEROL SULFATE HFA 108 (90 BASE) MCG/ACT IN AERS: 2.0000 | INHALATION_SPRAY | Freq: Four times a day (QID) | RESPIRATORY_TRACT | 2 refills | Status: AC | PRN

## 2016-09-25 MED ORDER — OXALIPLATIN CHEMO INJECTION 100 MG/20ML
85.0000 mg/m2 | Freq: Once | INTRAVENOUS | Status: AC
  Administered 2016-09-25: 180 mg via INTRAVENOUS
  Filled 2016-09-25: qty 36

## 2016-09-25 MED ORDER — DEXTROSE 5 % IV SOLN
Freq: Once | INTRAVENOUS | Status: AC
  Administered 2016-09-25: 11:00:00 via INTRAVENOUS

## 2016-09-25 MED ORDER — FLUOROURACIL CHEMO INJECTION 2.5 GM/50ML
400.0000 mg/m2 | Freq: Once | INTRAVENOUS | Status: AC
  Administered 2016-09-25: 850 mg via INTRAVENOUS
  Filled 2016-09-25: qty 17

## 2016-09-25 MED ORDER — LEUCOVORIN CALCIUM INJECTION 350 MG
403.0000 mg/m2 | Freq: Once | INTRAVENOUS | Status: AC
  Administered 2016-09-25: 850 mg via INTRAVENOUS
  Filled 2016-09-25: qty 35

## 2016-09-25 MED ORDER — HEPARIN SOD (PORK) LOCK FLUSH 100 UNIT/ML IV SOLN
500.0000 [IU] | Freq: Once | INTRAVENOUS | Status: DC | PRN
  Filled 2016-09-25: qty 5

## 2016-09-25 MED ORDER — SODIUM CHLORIDE 0.9% FLUSH
10.0000 mL | INTRAVENOUS | Status: DC | PRN
  Administered 2016-09-25: 10 mL
  Filled 2016-09-25: qty 10

## 2016-09-25 MED ORDER — DEXAMETHASONE SODIUM PHOSPHATE 10 MG/ML IJ SOLN
10.0000 mg | Freq: Once | INTRAMUSCULAR | Status: AC
  Administered 2016-09-25: 10 mg via INTRAVENOUS
  Filled 2016-09-25: qty 1

## 2016-09-25 MED ORDER — SODIUM CHLORIDE 0.9 % IV SOLN
2375.0000 mg/m2 | INTRAVENOUS | Status: DC
  Administered 2016-09-25: 5000 mg via INTRAVENOUS
  Filled 2016-09-25: qty 100

## 2016-09-25 NOTE — Progress Notes (Signed)
Patient tolerated infusion today without incidence. Patient discharged with home infusion pump running. Patient advised of when to return on Thursday to have it disconnected. Patient discharged ambulatory and in stable condition from clinic to self.

## 2016-09-25 NOTE — Progress Notes (Signed)
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 Negative MRI appearance of the brain.      06/08/2016 PET scan    IMPRESSION: 1. 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 2. Difficult to exclude additional small foci of peritoneal tumor in the pelvis. Small asymmetric right inguinal and external iliac nodes are mildly hypermetabolic, nonspecific. Correlation with prior imaging results recommended. 3. No evidence of hepatic or thoracic metastatic disease. 4. Subacute rib fractures on the left with associated metabolic activity. 5. Mild esophageal activity, possibly esophagitis. 6. Ventral and left inguinal hernias containing colon. Hepatic steatosis.       06/21/2016 Pathology Results    Soft Tissue Needle Core Biopsy, Right lower quadrant peritoneal mass - ADENOCARCINOMA, CONSISTENT WITH COLONIC PRIMARY.      HISTORY OF PRESENTING ILLNESS:  Edward Graham 56 y.o. male is here because of pT4a, N0 right sided colon cancer with a questionable 8.8 cm mass separate from the primary grouped with the omentum diagnosed 10/2014. He initially presented with right sided abdominal pain and complaint that his right flank was becoming progressively erythematous and edematous. Patient was previously being followed by Dr. Vallarie Mare of [facility] in [place].  He presented to the ED on 10/26/2014 with abdominal pain, a CT showed suspicion for right cecal mass with  perforation and decompression into the subcutaneous fat of the right lower abdominal wall and of the right pelvic side wall. He underwent a right hemicolectomy and debulking on 11/30/2014 and low grade mucinous adenocarcinoma was found without definitive invasion of neighboring organs. 33 LN were removed and all were negative for malignancy. Radial margins were unable to be evaluated due to fragmented specimen and perforation. There was a separate 8.8 cm tumor of the omentum was found as well, it was unclear whether this was an implant or a fragmented primary. The omentum itself was negative. He had multiple surgical would infections requiring debridement following his hemicolectomy. He was discharged on 11/12/14. Prior to starting adjuvant therapy he had baseline scans performed which were negative for metastatic disease.  He has received adjuvant CAPEOX, with progression seen on scans in the RLQ mass. He has moved to the [place] area and is transferring care. He was last seen at [facility] in [place] on 03/02/2016. He had a biopsy scheduled of the RLQ abdominal mass in march 2018, but did not show up.    He presents with his father, who is taking care of his medical care. He has not been doing well. He states that since his colectomy, he has a lot of diarrhea, about 15-20 bowel movements a day, without relief for Imodium. 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 He also has chronic vertigo/balance issues. Now he has a new onset rash. His sister washed his clothes in a new detergent and now he has a rash on his whole body. He has a history of eczema and the rash has now plaqued over. He tried hydrocortisone cream, but it  did not help. The rash is painful. Since January, he has not lost any weight. He uses an inhaler for allergies. The skin on the bottoms of his feet are cracking and painful. He reports leg swelling and abdominal pain. Denies dehydration, headaches, chest  pain, SOB, or any other concerns.   INTERVAL HISTORY: Edward Graham presents to the clinic today for follow up. He is accompanied by his father today.  Patient presents today for continued follow-up of his colon cancer and cycle 2 of chemo. He received cycle 1 of FOLFOX on 09/11/16 and tolerated it well. He denies any worsening of his chronic diarrhea since starting chemo and states that it has actually improved some. He has his chronic neuropathy and states that it has not worsened since starting chemo. He states he feels more fatigued since he started chemo, but it is tolerable. His rash is much improved. He's seen an allergist and is in the process of finding out what he is allergic to. He continues to smoke 1 ppd as well as drink beer. He knows he is supposed to quit both.  MEDICAL HISTORY:  Past Medical History:  Diagnosis Date  . Allergy   . Anxiety   . Colon cancer ([place])   . Depression   . Neuromuscular disorder [facility])     SURGICAL HISTORY: Past Surgical History:  Procedure Laterality Date  . COLON RESECTION    . IR FLUORO GUIDE PORT INSERTION RIGHT  06/01/2016  . IR US GUIDE VASC ACCESS RIGHT  06/01/2016    SOCIAL HISTORY: Social History   Social History  . Marital status: Divorced    Spouse name: N/A  . Number of children: N/A  . Years of education: N/A   Occupational History  . Not on file.   Social History Main Topics  . Smoking status: Current Every Day Smoker    Packs/day: 1.00    Years: 40.00    Types: Cigarettes  . Smokeless tobacco: Never Used  . Alcohol use Yes     Comment: 6 pack a week  . Drug use: No  . Sexual activity: Not on file     Comment: divorced   Other Topics Concern  . Not on file   Social History Narrative  . No narrative on file    FAMILY HISTORY: Family History  Problem Relation Age of Onset  . Emphysema Mother   . Asthma Sister     ALLERGIES:  is allergic to dust mite extract; mold extract [trichophyton]; and tree  extract.  MEDICATIONS:  Current Outpatient Prescriptions  Medication Sig Dispense Refill  . albuterol (PROVENTIL HFA;VENTOLIN HFA) 108 (90 Base) MCG/ACT inhaler Inhale 2 puffs into the lungs every 6 (six) hours as needed for wheezing or shortness of breath. 1 Inhaler 2  . dexamethasone (DECADRON) 4 MG tablet Take 2 tablets (8 mg total) by mouth daily. Start the day after chemotherapy for 2 days. Take with food. 30 tablet 1  . diphenoxylate-atropine (LOMOTIL) 2.5-0.025 MG tablet Take 1 tablet by mouth daily. 30 tablet 0  . fluorouracil CALGB 41962 in sodium chloride 0.9 % 150 mL Inject into the vein. Over 46 hours    . HYDROcodone-acetaminophen (NORCO/VICODIN) 5-325 MG tablet Take 1 tablet by mouth every 6 (six) hours as needed for moderate pain.    . hydrOXYzine (ATARAX/VISTARIL) 25 MG tablet Take 1 tablet (25 mg total) by mouth every 6 (six) hours as needed for itching. 30 tablet 0  . LEUCOVORIN CALCIUM IV Inject into  the vein. Every 2 weeks    . levocetirizine (XYZAL) 5 MG tablet Take 5 mg by mouth every evening.     . lidocaine-prilocaine (EMLA) cream Apply to affected area once 30 g 3  . Multiple Vitamin (THERA) TABS Take 1 tablet by mouth daily.     . mupirocin ointment (BACTROBAN) 2 % APPLY TO AFFECTED AREA AND INSIDE NOSE UP TO 3 TIMES A DAY AS NEEDED  99  . ondansetron (ZOFRAN) 8 MG tablet Take 1 tablet (8 mg total) by mouth 2 (two) times daily as needed for refractory nausea / vomiting. Start on day 3 after chemotherapy. 30 tablet 1  . OXALIPLATIN IV Inject into the vein. Every 2 weeks    . Potassium 99 MG TABS Take 400 mg by mouth daily.    . predniSONE (DELTASONE) 20 MG tablet 3 tabs po daily x 3 days, then 2 tabs x 3 days, then 1.5 tabs x 3 days, then 1 tab x 3 days, then 0.5 tabs x 3 days 27 tablet 0  . prochlorperazine (COMPAZINE) 10 MG tablet Take 1 tablet (10 mg total) by mouth every 6 (six) hours as needed (Nausea or vomiting). 30 tablet 1  . triamcinolone cream (KENALOG) 0.1 %  APPLY TO AFFECTED AREA UP TO TWICE A DAY AS NEEDED (NOT TO FACE, GROIN, OR UNDERARMS)  3   No current facility-administered medications for this visit.    Facility-Administered Medications Ordered in Other Visits  Medication Dose Route Frequency Provider Last Rate Last Dose  . heparin lock flush 100 unit/mL  500 Units Intracatheter Once PRN Twana First, MD      . sodium chloride flush (NS) 0.9 % injection 10 mL  10 mL Intracatheter PRN Twana First, MD       Review of Systems  Constitutional: Negative.  Negative for weight loss.       Good appetite   HENT: Negative.   Eyes: Negative.   Respiratory: Negative.  Negative for shortness of breath.   Cardiovascular: Negative for chest pain and leg swelling.  Gastrointestinal: Positive for diarrhea. Negative for abdominal pain.  Genitourinary: Negative.   Musculoskeletal: Negative.   Skin: Positive for rash (whole body, improving).  Neurological: Positive for tingling (hands and feet, severe). Negative for headaches.  Endo/Heme/Allergies: Negative.   Psychiatric/Behavioral: Negative.   All other systems reviewed and are negative. 14 point ROS was done and is otherwise as detailed above or in HPI  PHYSICAL EXAMINATION: ECOG PERFORMANCE STATUS: 1 - Symptomatic but completely ambulatory  Physical Exam  Constitutional: He is oriented to person, place, and time and well-developed, well-nourished, and in no distress.  HENT:  Head: Normocephalic and atraumatic.  Mouth/Throat: Oropharynx is clear and moist.  Eyes: Pupils are equal, round, and reactive to light. Conjunctivae and EOM are normal.  Neck: Normal range of motion. Neck supple.  Cardiovascular: Normal rate, regular rhythm and normal heart sounds.  Exam reveals no gallop and no friction rub.   No murmur heard. Pulmonary/Chest: Effort normal and breath sounds normal. No respiratory distress. He has no wheezes. He has no rales. He exhibits no tenderness.  Rhonchi in upper lung fields  bilaterally.  Abdominal: Soft. Bowel sounds are normal. He exhibits no distension. There is no tenderness. There is no rebound and no guarding.  2 large abdominal hernias, nontender, large surgical scars well healed  Musculoskeletal: Normal range of motion. He exhibits no edema.  Neurological: He is alert and oriented to person, place, and time. Gait normal.  Skin: Skin is warm and dry. No rash noted. No erythema.  Diffuse erythematous rash on arms and back tremendously improved. Some eczema. Very dry hands.  Nursing note and vitals reviewed.  LABORATORY DATA:  I have reviewed the data as listed Lab Results  Component Value Date   WBC 6.9 09/25/2016   HGB 13.2 09/25/2016   HCT 39.8 09/25/2016   MCV 100.8 (H) 09/25/2016   PLT 263 09/25/2016   CMP     Component Value Date/Time   NA 129 (L) 09/05/2016 1407   K 3.5 09/05/2016 1407   CL 92 (L) 09/05/2016 1407   CO2 26 09/05/2016 1407   GLUCOSE 96 09/05/2016 1407   BUN 5 (L) 09/05/2016 1407   CREATININE 0.84 09/05/2016 1407   CALCIUM 8.2 (L) 09/05/2016 1407   PROT 6.3 (L) 09/05/2016 1407   ALBUMIN 3.3 (L) 09/05/2016 1407   AST 63 (H) 09/05/2016 1407   ALT 53 09/05/2016 1407   ALKPHOS 114 09/05/2016 1407   BILITOT 1.0 09/05/2016 1407   GFRNONAA >60 09/05/2016 1407   GFRAA >60 09/05/2016 1407   PATHOLOGY: 06/21/16 Soft Tissue Needle Core Biopsy, Right lower quadrant peritoneal mass - ADENOCARCINOMA, CONSISTENT WITH COLONIC PRIMARY.  RADIOGRAPHIC STUDIES: I have personally reviewed the radiological images as listed and agreed with the findings in the report.  CT Abdomen 03/04/2016 IMPRESSION: 1. Findings highly concerning for disease progression with interval increase in the soft tissue mass within the right lower quadrant.  2. Apparent thickening of the rectum as can be seen in the setting of proctitis.  3. Hepatic stenosis.   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 Chronic diarrhea following colectomy. Diffuse contact dermatitis due to allergy from detergent Right foot swelling  PLAN: - Dr. Crisoforo Oxford, patient's surgeon, has recommended for patient to receive about 6-8 cycles of chemotherapy with restaging prior to him resecting his retroperitoneal mass.  - Proceed with cycle 2 of FOLFOX today. Continue FOLFOX q14 days. Plan to restage after cycle 4 of chemo.May add avastin if he is not having a good response after 4 cycles of chemo.  - Counseled him on both alcohol and smoke cessation and patient verbalized understanding.  - RTC in 4 weeks for follow up. - Refilled his inhaler. Patient has yet to establish care with a PCP despite the fact that I gave him a list of PCPs over a month ago.  All questions were answered. The patient knows to call the clinic with any problems, questions or concerns.   This note was electronically signed by:   Twana First, MD 09/25/2016 9:55 AM

## 2016-09-25 NOTE — Patient Instructions (Signed)
[facility] Discharge Instructions for Patients Receiving Chemotherapy   Beginning January 23rd 2017 lab work for the [facility] will be done in the  Main lab at [facility] on 1st floor. If you have a lab appointment with the [facility] please come in thru the  Main Entrance and check in at the main information desk   Today you received the following chemotherapy agents: folfox  To help prevent nausea and vomiting after your treatment, we encourage you to take your nausea medication as prescribed.   If you develop nausea and vomiting, or diarrhea that is not controlled by your medication, call the clinic.  The clinic phone number is [phone]. Office hours are Monday-Friday 8:30am-5:00pm.  BELOW ARE SYMPTOMS THAT SHOULD BE REPORTED IMMEDIATELY:  *FEVER GREATER THAN 101.0 F  *CHILLS WITH OR WITHOUT FEVER  NAUSEA AND VOMITING THAT IS NOT CONTROLLED WITH YOUR NAUSEA MEDICATION  *UNUSUAL SHORTNESS OF BREATH  *UNUSUAL BRUISING OR BLEEDING  TENDERNESS IN MOUTH AND THROAT WITH OR WITHOUT PRESENCE OF ULCERS  *URINARY PROBLEMS  *BOWEL PROBLEMS  UNUSUAL RASH Items with * indicate a potential emergency and should be followed up as soon as possible. If you have an emergency after office hours please contact your primary care physician or go to the nearest emergency department.  Please call the clinic during office hours if you have any questions or concerns.   You may also contact the Patient Navigator at [phone] should you have any questions or need assistance in obtaining follow up care.      Resources For Cancer Patients and their Caregivers ? American Cancer Society: Can assist with transportation, wigs, general needs, runs Look Good Feel Better.        3185070867 ? Cancer Care: Provides financial assistance, online support groups, medication/co-pay assistance.  1-800-813-HOPE 2493328200) ? [place] Assists  [place] cancer patients and their families through emotional , educational and financial support.  463-545-2101 ? [place] DSS Where to apply for food stamps, Medicaid and utility assistance. 640-204-2988 ? RCATS: Transportation to medical appointments. (726) 073-4334 ? Social Security Administration: May apply for disability if have a Stage IV cancer. (607)305-6670 (225) 406-3783 ? [employer], Disability and Transit Services: Assists with nutrition, care and transit needs. 854-111-5995

## 2016-09-26 LAB — CEA: CEA: 8 ng/mL — ABNORMAL HIGH (ref 0.0–4.7)

## 2016-09-27 ENCOUNTER — Encounter (HOSPITAL_COMMUNITY): Payer: Self-pay

## 2016-09-27 ENCOUNTER — Encounter (HOSPITAL_BASED_OUTPATIENT_CLINIC_OR_DEPARTMENT_OTHER): Payer: BLUE CROSS/BLUE SHIELD

## 2016-09-27 VITALS — BP 130/81 | HR 70 | Temp 98.2°F | Resp 18

## 2016-09-27 DIAGNOSIS — C182 Malignant neoplasm of ascending colon: Secondary | ICD-10-CM | POA: Diagnosis not present

## 2016-09-27 MED ORDER — HEPARIN SOD (PORK) LOCK FLUSH 100 UNIT/ML IV SOLN
500.0000 [IU] | Freq: Once | INTRAVENOUS | Status: AC | PRN
  Administered 2016-09-27: 500 [IU]

## 2016-09-27 MED ORDER — SODIUM CHLORIDE 0.9% FLUSH
10.0000 mL | INTRAVENOUS | Status: DC | PRN
  Administered 2016-09-27: 10 mL
  Filled 2016-09-27: qty 10

## 2016-09-27 MED ORDER — HEPARIN SOD (PORK) LOCK FLUSH 100 UNIT/ML IV SOLN
INTRAVENOUS | Status: AC
  Filled 2016-09-27: qty 5

## 2016-09-27 NOTE — Progress Notes (Signed)
Patient arrived with home infusion pump off. Patient denies having any issues with pump over the past 2 days. Pump disconnected and port flushed with normal saline 71ml and heparin 500 units. Needle removed intact. Patient tolerated without incidence. Patient discharged ambulatory and in stable condition from clinic to self. Patient to follow up as scheduled.

## 2016-09-27 NOTE — Patient Instructions (Signed)
[place] at [facility] Discharge Instructions  RECOMMENDATIONS MADE BY THE CONSULTANT AND ANY TEST RESULTS WILL BE SENT TO YOUR REFERRING PHYSICIAN.  You had your home infusion pump disconnected. Follow up in 2 weeks for your next infusion.   Thank you for choosing [place] at [facility] to provide your oncology and hematology care.  To afford each patient quality time with our provider, please arrive at least 15 minutes before your scheduled appointment time.    If you have a lab appointment with the [facility] please come in thru the  Main Entrance and check in at the main information desk  You need to re-schedule your appointment should you arrive 10 or more minutes late.  We strive to give you quality time with our providers, and arriving late affects you and other patients whose appointments are after yours.  Also, if you no show three or more times for appointments you may be dismissed from the clinic at the providers discretion.     Again, thank you for choosing [facility].  Our hope is that these requests will decrease the amount of time that you wait before being seen by our physicians.       _____________________________________________________________  Should you have questions after your visit to [facility], please contact our office at [phone] between the hours of 8:30 a.m. and 4:30 p.m.  Voicemails left after 4:30 p.m. will not be returned until the following business day.  For prescription refill requests, have your pharmacy contact our office.       Resources For Cancer Patients and their Caregivers ? American Cancer Society: Can assist with transportation, wigs, general needs, runs Look Good Feel Better.        219-057-1897 ? Cancer Care: Provides financial assistance, online support groups, medication/co-pay assistance.  1-800-813-HOPE (228) 333-3062) ? [place] Assists [place] cancer patients and their families through emotional , educational and financial support.  (212)691-2148 ? [place] DSS Where to apply for food stamps, Medicaid and utility assistance. (804)773-3705 ? [employer]ATS: Transportation to medical appointments. 620-324-6681 ? Social Security Administration: May apply for disability if have a Stage IV cancer. 548-429-1109 903-226-7402 ? [employer], Disability and Transit Services: Assists with nutrition, care and transit needs. [place] Support Programs: @10RELATIVEDAYS @ > Cancer Support Group  2nd Tuesday of the month 1pm-2pm, Journey Room  > Creative Journey  3rd Tuesday of the month 1130am-1pm, Journey Room  > Look Good Feel Better  1st Wednesday of the month 10am-12 noon, Journey Room (Call [place] to register [phone])

## 2016-10-09 ENCOUNTER — Encounter (HOSPITAL_COMMUNITY): Payer: BLUE CROSS/BLUE SHIELD | Attending: Oncology

## 2016-10-09 ENCOUNTER — Encounter (HOSPITAL_COMMUNITY): Payer: Self-pay

## 2016-10-09 VITALS — BP 140/78 | HR 87 | Temp 98.1°F | Resp 20 | Wt 194.2 lb

## 2016-10-09 DIAGNOSIS — L299 Pruritus, unspecified: Secondary | ICD-10-CM | POA: Insufficient documentation

## 2016-10-09 DIAGNOSIS — Z5111 Encounter for antineoplastic chemotherapy: Secondary | ICD-10-CM

## 2016-10-09 DIAGNOSIS — C182 Malignant neoplasm of ascending colon: Secondary | ICD-10-CM

## 2016-10-09 LAB — COMPREHENSIVE METABOLIC PANEL
ALBUMIN: 3.5 g/dL (ref 3.5–5.0)
ALT: 30 U/L (ref 17–63)
ANION GAP: 11 (ref 5–15)
AST: 33 U/L (ref 15–41)
Alkaline Phosphatase: 73 U/L (ref 38–126)
BILIRUBIN TOTAL: 0.4 mg/dL (ref 0.3–1.2)
BUN: 6 mg/dL (ref 6–20)
CO2: 21 mmol/L — AB (ref 22–32)
Calcium: 8.3 mg/dL — ABNORMAL LOW (ref 8.9–10.3)
Chloride: 107 mmol/L (ref 101–111)
Creatinine, Ser: 0.76 mg/dL (ref 0.61–1.24)
GFR calc non Af Amer: >60 mL/min (ref >60)
GLUCOSE: 91 mg/dL (ref 65–99)
POTASSIUM: 3.1 mmol/L — AB (ref 3.5–5.1)
SODIUM: 139 mmol/L (ref 135–145)
Total Protein: 6.8 g/dL (ref 6.5–8.1)

## 2016-10-09 LAB — CBC WITH DIFFERENTIAL/PLATELET
BASOS ABS: 0 10*3/uL (ref 0.0–0.1)
Basophils Relative: 1 %
EOS PCT: 10 %
Eosinophils Absolute: 0.8 10*3/uL — ABNORMAL HIGH (ref 0.0–0.7)
HCT: 41.7 % (ref 39.0–52.0)
Hemoglobin: 14.1 g/dL (ref 13.0–17.0)
Lymphocytes Relative: 17 %
Lymphs Abs: 1.4 10*3/uL (ref 0.7–4.0)
MCH: 33.4 pg (ref 26.0–34.0)
MCHC: 33.8 g/dL (ref 30.0–36.0)
MCV: 98.8 fL (ref 78.0–100.0)
MONO ABS: 0.7 10*3/uL (ref 0.1–1.0)
Monocytes Relative: 9 %
NEUTROS ABS: 5.4 10*3/uL (ref 1.7–7.7)
Neutrophils Relative %: 63 %
Platelets: 131 10*3/uL — ABNORMAL LOW (ref 150–400)
RBC: 4.22 MIL/uL (ref 4.22–5.81)
RDW: 15.3 % (ref 11.5–15.5)
WBC: 8.4 10*3/uL (ref 4.0–10.5)

## 2016-10-09 MED ORDER — DEXTROSE 5 % IV SOLN
Freq: Once | INTRAVENOUS | Status: AC
  Administered 2016-10-09: 10:00:00 via INTRAVENOUS

## 2016-10-09 MED ORDER — SODIUM CHLORIDE 0.9 % IV SOLN
2375.0000 mg/m2 | INTRAVENOUS | Status: DC
  Administered 2016-10-09: 5000 mg via INTRAVENOUS
  Filled 2016-10-09: qty 100

## 2016-10-09 MED ORDER — DIPHENHYDRAMINE HCL 25 MG PO CAPS
25.0000 mg | ORAL_CAPSULE | Freq: Once | ORAL | Status: AC
  Administered 2016-10-09: 25 mg via ORAL

## 2016-10-09 MED ORDER — DIPHENHYDRAMINE HCL 25 MG PO CAPS
ORAL_CAPSULE | ORAL | Status: AC
  Filled 2016-10-09: qty 1

## 2016-10-09 MED ORDER — PALONOSETRON HCL INJECTION 0.25 MG/5ML
0.2500 mg | Freq: Once | INTRAVENOUS | Status: AC
  Administered 2016-10-09: 0.25 mg via INTRAVENOUS
  Filled 2016-10-09: qty 5

## 2016-10-09 MED ORDER — POTASSIUM CHLORIDE CRYS ER 20 MEQ PO TBCR
60.0000 meq | EXTENDED_RELEASE_TABLET | Freq: Once | ORAL | Status: AC
  Administered 2016-10-09: 60 meq via ORAL
  Filled 2016-10-09: qty 3

## 2016-10-09 MED ORDER — FLUOROURACIL CHEMO INJECTION 2.5 GM/50ML
400.0000 mg/m2 | Freq: Once | INTRAVENOUS | Status: AC
  Administered 2016-10-09: 850 mg via INTRAVENOUS
  Filled 2016-10-09: qty 17

## 2016-10-09 MED ORDER — OXALIPLATIN CHEMO INJECTION 100 MG/20ML
85.0000 mg/m2 | Freq: Once | INTRAVENOUS | Status: AC
  Administered 2016-10-09: 180 mg via INTRAVENOUS
  Filled 2016-10-09: qty 36

## 2016-10-09 MED ORDER — DEXAMETHASONE SODIUM PHOSPHATE 10 MG/ML IJ SOLN
10.0000 mg | Freq: Once | INTRAMUSCULAR | Status: AC
Start: 2016-10-09 — End: 2016-10-09
  Administered 2016-10-09: 10 mg via INTRAVENOUS
  Filled 2016-10-09: qty 1

## 2016-10-09 MED ORDER — LEUCOVORIN CALCIUM INJECTION 350 MG
403.0000 mg/m2 | Freq: Once | INTRAVENOUS | Status: AC
  Administered 2016-10-09: 850 mg via INTRAVENOUS
  Filled 2016-10-09: qty 35

## 2016-10-09 NOTE — Progress Notes (Signed)
Tolerated tx w/o adverse reaction. Alert, in no distress.  VSS.  Discharged ambulatory in c/o family for transport home.  

## 2016-10-09 NOTE — Progress Notes (Signed)
Patient complained of itching all over his body, no medications have been given to him so far today. Patient requesting benadryl. Will given per orders.

## 2016-10-11 ENCOUNTER — Encounter (HOSPITAL_COMMUNITY): Payer: Self-pay

## 2016-10-11 ENCOUNTER — Encounter (HOSPITAL_BASED_OUTPATIENT_CLINIC_OR_DEPARTMENT_OTHER): Payer: BLUE CROSS/BLUE SHIELD

## 2016-10-11 VITALS — BP 121/69 | HR 96 | Temp 98.3°F | Resp 20

## 2016-10-11 DIAGNOSIS — C182 Malignant neoplasm of ascending colon: Secondary | ICD-10-CM

## 2016-10-11 MED ORDER — HEPARIN SOD (PORK) LOCK FLUSH 100 UNIT/ML IV SOLN
500.0000 [IU] | Freq: Once | INTRAVENOUS | Status: AC | PRN
Start: 2016-10-11 — End: 2016-10-11
  Administered 2016-10-11: 500 [IU]

## 2016-10-11 MED ORDER — SODIUM CHLORIDE 0.9% FLUSH
10.0000 mL | INTRAVENOUS | Status: DC | PRN
  Administered 2016-10-11: 10 mL
  Filled 2016-10-11: qty 10

## 2016-10-11 NOTE — Progress Notes (Signed)
Edward Graham presents to have home infusion pump d/c'd and for port-a-cath deaccess with flush.  Portacath located right chest wall accessed with  H 20 needle.  Good blood return present. Portacath flushed with NS and 500U/32ml Heparin, and needle removed intact.  Procedure tolerated well and without incident.  Discharged ambulatory.

## 2016-10-23 ENCOUNTER — Encounter (HOSPITAL_BASED_OUTPATIENT_CLINIC_OR_DEPARTMENT_OTHER): Payer: BLUE CROSS/BLUE SHIELD | Admitting: Oncology

## 2016-10-23 ENCOUNTER — Encounter (HOSPITAL_BASED_OUTPATIENT_CLINIC_OR_DEPARTMENT_OTHER): Payer: BLUE CROSS/BLUE SHIELD

## 2016-10-23 ENCOUNTER — Encounter (HOSPITAL_COMMUNITY): Payer: Self-pay

## 2016-10-23 VITALS — BP 106/71 | HR 92 | Temp 98.0°F | Resp 20 | Wt 189.0 lb

## 2016-10-23 DIAGNOSIS — R1900 Intra-abdominal and pelvic swelling, mass and lump, unspecified site: Secondary | ICD-10-CM

## 2016-10-23 DIAGNOSIS — R21 Rash and other nonspecific skin eruption: Secondary | ICD-10-CM

## 2016-10-23 DIAGNOSIS — Z720 Tobacco use: Secondary | ICD-10-CM | POA: Diagnosis not present

## 2016-10-23 DIAGNOSIS — Z5111 Encounter for antineoplastic chemotherapy: Secondary | ICD-10-CM

## 2016-10-23 DIAGNOSIS — K529 Noninfective gastroenteritis and colitis, unspecified: Secondary | ICD-10-CM

## 2016-10-23 DIAGNOSIS — C182 Malignant neoplasm of ascending colon: Secondary | ICD-10-CM

## 2016-10-23 LAB — COMPREHENSIVE METABOLIC PANEL
ALK PHOS: 79 U/L (ref 38–126)
ALT: 24 U/L (ref 17–63)
AST: 28 U/L (ref 15–41)
Albumin: 3.7 g/dL (ref 3.5–5.0)
Anion gap: 8 (ref 5–15)
BUN: 11 mg/dL (ref 6–20)
CALCIUM: 8.5 mg/dL — AB (ref 8.9–10.3)
CO2: 21 mmol/L — ABNORMAL LOW (ref 22–32)
CREATININE: 0.76 mg/dL (ref 0.61–1.24)
Chloride: 104 mmol/L (ref 101–111)
Glucose, Bld: 108 mg/dL — ABNORMAL HIGH (ref 65–99)
Potassium: 3.3 mmol/L — ABNORMAL LOW (ref 3.5–5.1)
Sodium: 133 mmol/L — ABNORMAL LOW (ref 135–145)
TOTAL PROTEIN: 6.9 g/dL (ref 6.5–8.1)
Total Bilirubin: 0.7 mg/dL (ref 0.3–1.2)

## 2016-10-23 LAB — CBC WITH DIFFERENTIAL/PLATELET
Basophils Absolute: 0 10*3/uL (ref 0.0–0.1)
Basophils Relative: 1 %
EOS PCT: 7 %
Eosinophils Absolute: 0.5 10*3/uL (ref 0.0–0.7)
HCT: 42.1 % (ref 39.0–52.0)
HEMOGLOBIN: 14.5 g/dL (ref 13.0–17.0)
LYMPHS ABS: 0.7 10*3/uL (ref 0.7–4.0)
LYMPHS PCT: 10 %
MCH: 34 pg (ref 26.0–34.0)
MCHC: 34.4 g/dL (ref 30.0–36.0)
MCV: 98.8 fL (ref 78.0–100.0)
MONOS PCT: 7 %
Monocytes Absolute: 0.6 10*3/uL (ref 0.1–1.0)
NEUTROS PCT: 76 %
Neutro Abs: 5.6 10*3/uL (ref 1.7–7.7)
Platelets: 102 10*3/uL — ABNORMAL LOW (ref 150–400)
RBC: 4.26 MIL/uL (ref 4.22–5.81)
RDW: 15.3 % (ref 11.5–15.5)
WBC: 7.5 10*3/uL (ref 4.0–10.5)

## 2016-10-23 MED ORDER — LEUCOVORIN CALCIUM INJECTION 350 MG
403.0000 mg/m2 | Freq: Once | INTRAVENOUS | Status: AC
  Administered 2016-10-23: 850 mg via INTRAVENOUS
  Filled 2016-10-23: qty 35

## 2016-10-23 MED ORDER — PALONOSETRON HCL INJECTION 0.25 MG/5ML
0.2500 mg | Freq: Once | INTRAVENOUS | Status: AC
  Administered 2016-10-23: 0.25 mg via INTRAVENOUS

## 2016-10-23 MED ORDER — DEXAMETHASONE SODIUM PHOSPHATE 10 MG/ML IJ SOLN
INTRAMUSCULAR | Status: AC
  Filled 2016-10-23: qty 1

## 2016-10-23 MED ORDER — OXALIPLATIN CHEMO INJECTION 100 MG/20ML
85.0000 mg/m2 | Freq: Once | INTRAVENOUS | Status: AC
  Administered 2016-10-23: 180 mg via INTRAVENOUS
  Filled 2016-10-23: qty 36

## 2016-10-23 MED ORDER — POTASSIUM CHLORIDE CRYS ER 20 MEQ PO TBCR
40.0000 meq | EXTENDED_RELEASE_TABLET | Freq: Once | ORAL | Status: AC
  Administered 2016-10-23: 40 meq via ORAL
  Filled 2016-10-23: qty 2

## 2016-10-23 MED ORDER — DEXTROSE 5 % IV SOLN
Freq: Once | INTRAVENOUS | Status: AC
Start: 2016-10-23 — End: 2016-10-23
  Administered 2016-10-23: 11:00:00 via INTRAVENOUS

## 2016-10-23 MED ORDER — DEXAMETHASONE SODIUM PHOSPHATE 10 MG/ML IJ SOLN
10.0000 mg | Freq: Once | INTRAMUSCULAR | Status: AC
  Administered 2016-10-23: 10 mg via INTRAVENOUS

## 2016-10-23 MED ORDER — SODIUM CHLORIDE 0.9% FLUSH
10.0000 mL | INTRAVENOUS | Status: DC | PRN
  Administered 2016-10-23: 10 mL
  Filled 2016-10-23: qty 10

## 2016-10-23 MED ORDER — SODIUM CHLORIDE 0.9 % IV SOLN
2375.0000 mg/m2 | INTRAVENOUS | Status: DC
  Administered 2016-10-23: 5000 mg via INTRAVENOUS
  Filled 2016-10-23: qty 100

## 2016-10-23 MED ORDER — DIPHENHYDRAMINE HCL 50 MG/ML IJ SOLN
INTRAMUSCULAR | Status: AC
  Filled 2016-10-23: qty 1

## 2016-10-23 MED ORDER — FLUOROURACIL CHEMO INJECTION 2.5 GM/50ML
400.0000 mg/m2 | Freq: Once | INTRAVENOUS | Status: AC
  Administered 2016-10-23: 850 mg via INTRAVENOUS
  Filled 2016-10-23: qty 17

## 2016-10-23 MED ORDER — DIPHENHYDRAMINE HCL 50 MG/ML IJ SOLN
25.0000 mg | Freq: Once | INTRAMUSCULAR | Status: AC
  Administered 2016-10-23: 25 mg via INTRAVENOUS

## 2016-10-23 MED ORDER — PALONOSETRON HCL INJECTION 0.25 MG/5ML
INTRAVENOUS | Status: AC
  Filled 2016-10-23: qty 5

## 2016-10-23 NOTE — Patient Instructions (Signed)
[facility] at [facility] Discharge Instructions  RECOMMENDATIONS MADE BY THE CONSULTANT AND ANY TEST RESULTS WILL BE SENT TO YOUR REFERRING PHYSICIAN.  You were seen today by Dr. [name] [name]    Thank you for choosing [facility] at [facility] to provide your oncology and hematology care.  To afford each patient quality time with our provider, please arrive at least 15 minutes before your scheduled appointment time.    If you have a lab appointment with the Cancer Center please come in thru the  Main Entrance and check in at the main information desk  You need to re-schedule your appointment should you arrive 10 or more minutes late.  We strive to give you quality time with our providers, and arriving late affects you and other patients whose appointments are after yours.  Also, if you no show three or more times for appointments you may be dismissed from the clinic at the providers discretion.     Again, thank you for choosing [facility].  Our hope is that these requests will decrease the amount of time that you wait before being seen by our physicians.       _____________________________________________________________  Should you have questions after your visit to [facility], please contact our office at [phone] between the hours of 8:30 a.m. and 4:30 p.m.  Voicemails left after 4:30 p.m. will not be returned until the following business day.  For prescription refill requests, have your pharmacy contact our office.       Resources For Cancer Patients and their Caregivers ? American Cancer Society: Can assist with transportation, wigs, general needs, runs Look Good Feel Better.        1-888-227-6333 ? Cancer Care: Provides financial assistance, online support groups, medication/co-pay assistance.  1-800-813-HOPE (4673) ? [facility] Assists [place] cancer patients and their  families through emotional , educational and financial support.  336-427-4357 ? [place] DSS Where to apply for food stamps, Medicaid and utility assistance. 336-342-1394 ? [employer]: Transportation to medical appointments. 336-347-2287 ? Social Security Administration: May apply for disability if have a Stage IV cancer. 336-342-7796 1-800-772-1213 ? [place] Aging, Disability and Transit Services: Assists with nutrition, care and transit needs. 336-349-2343  Cancer Center Support Programs: @10RELATIVEDAYS@ > Cancer Support Group  2nd Tuesday of the month 1pm-2pm, Journey Room  > Creative Journey  3rd Tuesday of the month 1130am-1pm, Journey Room  > Look Good Feel Better  1st Wednesday of the month 10am-12 noon, Journey Room (Call American Cancer [employer] to register 1-800-395-5775)    

## 2016-10-23 NOTE — Patient Instructions (Signed)
[facility] Discharge Instructions for Patients Receiving Chemotherapy   Beginning January 23rd 2017 lab work for the Cancer Center will be done in the  Main lab at [facility] on 1st floor. If you have a lab appointment with the Cancer Center please come in thru the  Main Entrance and check in at the main information desk   Today you received the following chemotherapy agents   To help prevent nausea and vomiting after your treatment, we encourage you to take your nausea medication     If you develop nausea and vomiting, or diarrhea that is not controlled by your medication, call the clinic.  The clinic phone number is [phone]. Office hours are Monday-Friday 8:30am-5:00pm.  BELOW ARE SYMPTOMS THAT SHOULD BE REPORTED IMMEDIATELY:  *FEVER GREATER THAN 101.0 F  *CHILLS WITH OR WITHOUT FEVER  NAUSEA AND VOMITING THAT IS NOT CONTROLLED WITH YOUR NAUSEA MEDICATION  *UNUSUAL SHORTNESS OF BREATH  *UNUSUAL BRUISING OR BLEEDING  TENDERNESS IN MOUTH AND THROAT WITH OR WITHOUT PRESENCE OF ULCERS  *URINARY PROBLEMS  *BOWEL PROBLEMS  UNUSUAL RASH Items with * indicate a potential emergency and should be followed up as soon as possible. If you have an emergency after office hours please contact your primary care physician or go to the nearest emergency department.  Please call the clinic during office hours if you have any questions or concerns.   You may also contact the Patient Navigator at [phone] should you have any questions or need assistance in obtaining follow up care.      Resources For Cancer Patients and their Caregivers ? American Cancer Society: Can assist with transportation, wigs, general needs, runs Look Good Feel Better.        1-888-227-6333 ? Cancer Care: Provides financial assistance, online support groups, medication/co-pay assistance.  1-800-813-HOPE (4673) ? [facility] Assists [place] cancer  patients and their families through emotional , educational and financial support.  336-427-4357 ? [place] DSS Where to apply for food stamps, Medicaid and utility assistance. 336-342-1394 ? [facility]: Transportation to medical appointments. 336-347-2287 ? Social Security Administration: May apply for disability if have a Stage IV cancer. 336-342-7796 1-800-772-1213 ? [place] Aging, Disability and Transit Services: Assists with nutrition, care and transit needs. 336-349-2343         

## 2016-10-23 NOTE — Progress Notes (Signed)
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 Negative MRI appearance of the brain.      06/08/2016 PET scan    IMPRESSION: 1. 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 2. Difficult to exclude additional small foci of peritoneal tumor in the pelvis. Small asymmetric right inguinal and external iliac nodes are mildly hypermetabolic, nonspecific. Correlation with prior imaging results recommended. 3. No evidence of hepatic or thoracic metastatic disease. 4. Subacute rib fractures on the left with associated metabolic activity. 5. Mild esophageal activity, possibly esophagitis. 6. Ventral and left inguinal hernias containing colon. Hepatic steatosis.       06/21/2016 Pathology Results    Soft Tissue Needle Core Biopsy, Right lower quadrant peritoneal mass - ADENOCARCINOMA, CONSISTENT WITH COLONIC PRIMARY.      HISTORY OF PRESENTING ILLNESS:  Edward Graham 56 y.o. male is here because of pT4a, N0 right sided colon cancer with a questionable 8.8 cm mass separate from the primary grouped with the omentum diagnosed 10/2014. He initially presented with right sided abdominal pain and complaint that his right flank was becoming progressively erythematous and edematous. Patient was previously being followed by Dr. Vallarie Mare of [facility] in [place].  He presented to the ED on 10/26/2014 with abdominal pain, a CT showed suspicion for right cecal mass with  perforation and decompression into the subcutaneous fat of the right lower abdominal wall and of the right pelvic side wall. He underwent a right hemicolectomy and debulking on 11/30/2014 and low grade mucinous adenocarcinoma was found without definitive invasion of neighboring organs. 33 LN were removed and all were negative for malignancy. Radial margins were unable to be evaluated due to fragmented specimen and perforation. There was a separate 8.8 cm tumor of the omentum was found as well, it was unclear whether this was an implant or a fragmented primary. The omentum itself was negative. He had multiple surgical would infections requiring debridement following his hemicolectomy. He was discharged on 11/12/14. Prior to starting adjuvant therapy he had baseline scans performed which were negative for metastatic disease.  He has received adjuvant CAPEOX, with progression seen on scans in the RLQ mass. He has moved to the [place] area and is transferring care. He was last seen at [facility] in [place] on 03/02/2016. He had a biopsy scheduled of the RLQ abdominal mass in march 2018, but did not show up.    He presents with his father, who is taking care of his medical care. He has not been doing well. He states that since his colectomy, he has a lot of diarrhea, about 15-20 bowel movements a day, without relief for Imodium. 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 He also has chronic vertigo/balance issues. Now he has a new onset rash. His sister washed his clothes in a new detergent and now he has a rash on his whole body. He has a history of eczema and the rash has now plaqued over. He tried hydrocortisone cream, but it  did not help. The rash is painful. Since January, he has not lost any weight. He uses an inhaler for allergies. The skin on the bottoms of his feet are cracking and painful. He reports leg swelling and abdominal pain. Denies dehydration, headaches, chest  pain, SOB, or any other concerns.   INTERVAL HISTORY: ANTHONNY SCHILLER presents to the clinic today for follow up. He is accompanied by his father today.  Patient presents today for continued follow-up of his colon cancer and cycle 4 of chemo. He states that he is tolerating chemo well. He continues to have his chronic diarrhea however it has not been worsened by chemotherapy. He continues to take anti-diarrhea medication and it helps to control his symptoms. He states he will start injections with his dermatologist soon for his chronic diffuse rash. He states his appetite is good. He has quit alcohol. He continues to smoke 15 cigarettes a day but wants to quit completely by the end of this month.   MEDICAL HISTORY:  Past Medical History:  Diagnosis Date  . Allergy   . Anxiety   . Colon cancer ([place])   . Depression   . Neuromuscular disorder [facility])     SURGICAL HISTORY: Past Surgical History:  Procedure Laterality Date  . COLON RESECTION    . IR FLUORO GUIDE PORT INSERTION RIGHT  06/01/2016  . IR US GUIDE VASC ACCESS RIGHT  06/01/2016    SOCIAL HISTORY: Social History   Social History  . Marital status: Divorced    Spouse name: N/A  . Number of children: N/A  . Years of education: N/A   Occupational History  . Not on file.   Social History Main Topics  . Smoking status: Current Every Day Smoker    Packs/day: 1.00    Years: 40.00    Types: Cigarettes  . Smokeless tobacco: Never Used  . Alcohol use Yes     Comment: 6 pack a week  . Drug use: No  . Sexual activity: Not on file     Comment: divorced   Other Topics Concern  . Not on file   Social History Narrative  . No narrative on file    FAMILY HISTORY: Family History  Problem Relation Age of Onset  . Emphysema Mother   . Asthma Sister     ALLERGIES:  is allergic to codeine; dust mite extract; mold extract [trichophyton]; and tree extract.  MEDICATIONS:  Current Outpatient Prescriptions  Medication Sig  Dispense Refill  . albuterol (PROVENTIL HFA;VENTOLIN HFA) 108 (90 Base) MCG/ACT inhaler Inhale 2 puffs into the lungs every 6 (six) hours as needed for wheezing or shortness of breath. 1 Inhaler 2  . dexamethasone (DECADRON) 4 MG tablet Take 2 tablets (8 mg total) by mouth daily. Start the day after chemotherapy for 2 days. Take with food. 30 tablet 1  . fluorouracil CALGB 25956 in sodium chloride 0.9 % 150 mL Inject into the vein. Over 46 hours    . HYDROcodone-acetaminophen (NORCO/VICODIN) 5-325 MG tablet Take 1 tablet by mouth every 6 (six) hours as needed for moderate pain.    . hydrOXYzine (ATARAX/VISTARIL) 25 MG tablet Take 1 tablet (25 mg total) by mouth every 6 (six) hours as needed for itching. 30 tablet 0  . LEUCOVORIN CALCIUM IV Inject into the vein. Every 2 weeks    . levocetirizine (XYZAL) 5 MG tablet Take 5 mg by mouth every evening.     . lidocaine-prilocaine (EMLA) cream Apply to affected area once 30  g 3  . Multiple Vitamin (THERA) TABS Take 1 tablet by mouth daily.     . mupirocin ointment (BACTROBAN) 2 % APPLY TO AFFECTED AREA AND INSIDE NOSE UP TO 3 TIMES A DAY AS NEEDED  99  . ondansetron (ZOFRAN) 8 MG tablet Take 1 tablet (8 mg total) by mouth 2 (two) times daily as needed for refractory nausea / vomiting. Start on day 3 after chemotherapy. 30 tablet 1  . OXALIPLATIN IV Inject into the vein. Every 2 weeks    . Potassium 99 MG TABS Take 400 mg by mouth daily.    . predniSONE (DELTASONE) 20 MG tablet 3 tabs po daily x 3 days, then 2 tabs x 3 days, then 1.5 tabs x 3 days, then 1 tab x 3 days, then 0.5 tabs x 3 days 27 tablet 0  . prochlorperazine (COMPAZINE) 10 MG tablet Take 1 tablet (10 mg total) by mouth every 6 (six) hours as needed (Nausea or vomiting). 30 tablet 1  . triamcinolone cream (KENALOG) 0.1 % APPLY TO AFFECTED AREA UP TO TWICE A DAY AS NEEDED (NOT TO FACE, GROIN, OR UNDERARMS)  3   No current facility-administered medications for this visit.     Facility-Administered Medications Ordered in Other Visits  Medication Dose Route Frequency Provider Last Rate Last Dose  . fluorouracil (ADRUCIL) 5,000 mg in sodium chloride 0.9 % 150 mL chemo infusion  2,375 mg/m2 (Treatment Plan Recorded) Intravenous 1 day or 1 dose Twana First, MD      . fluorouracil (ADRUCIL) chemo injection 850 mg  400 mg/m2 (Treatment Plan Recorded) Intravenous Once Twana First, MD      . leucovorin 850 mg in dextrose 5 % 250 mL infusion  403 mg/m2 (Treatment Plan Recorded) Intravenous Once Twana First, MD 146 mL/hr at 10/23/16 1140 850 mg at 10/23/16 1140  . oxaliplatin (ELOXATIN) 180 mg in dextrose 5 % 500 mL chemo infusion  85 mg/m2 (Treatment Plan Recorded) Intravenous Once Twana First, MD 268 mL/hr at 10/23/16 1140 180 mg at 10/23/16 1140  . sodium chloride flush (NS) 0.9 % injection 10 mL  10 mL Intracatheter PRN Twana First, MD   10 mL at 10/23/16 0900   Review of Systems  Constitutional: Negative.  Negative for weight loss.       Good appetite   HENT: Negative.   Eyes: Negative.   Respiratory: Negative.  Negative for shortness of breath.   Cardiovascular: Negative for chest pain and leg swelling.  Gastrointestinal: Positive for diarrhea. Negative for abdominal pain.  Genitourinary: Negative.   Musculoskeletal: Negative.   Skin: Positive for rash (whole body, stable).  Neurological: Positive for tingling (hands and feet, severe). Negative for headaches.  Endo/Heme/Allergies: Negative.   Psychiatric/Behavioral: Negative.   All other systems reviewed and are negative. 14 point ROS was done and is otherwise as detailed above or in HPI  PHYSICAL EXAMINATION: ECOG PERFORMANCE STATUS: 1 - Symptomatic but completely ambulatory  Physical Exam  Constitutional: He is oriented to person, place, and time and well-developed, well-nourished, and in no distress.  HENT:  Head: Normocephalic and atraumatic.  Mouth/Throat: Oropharynx is clear and moist.  Eyes:  Pupils are equal, round, and reactive to light. Conjunctivae and EOM are normal.  Neck: Normal range of motion. Neck supple.  Cardiovascular: Normal rate, regular rhythm and normal heart sounds.  Exam reveals no gallop and no friction rub.   No murmur heard. Pulmonary/Chest: Effort normal and breath sounds normal. No respiratory distress. He has no  wheezes. He has no rales. He exhibits no tenderness.  Abdominal: Soft. Bowel sounds are normal. He exhibits no distension. There is no tenderness. There is no rebound and no guarding.  2 large abdominal hernias, nontender, large surgical scars well healed  Musculoskeletal: Normal range of motion. He exhibits no edema.  Neurological: He is alert and oriented to person, place, and time. Gait normal.  Skin: Skin is warm and dry. No rash noted. No erythema.  Diffuse erythematous rash on arms and back tremendously improved. Some eczema. Very dry hands.  Nursing note and vitals reviewed.  LABORATORY DATA:  I have reviewed the data as listed Lab Results  Component Value Date   WBC 7.5 10/23/2016   HGB 14.5 10/23/2016   HCT 42.1 10/23/2016   MCV 98.8 10/23/2016   PLT 102 (L) 10/23/2016   CMP     Component Value Date/Time   NA 133 (L) 10/23/2016 0940   K 3.3 (L) 10/23/2016 0940   CL 104 10/23/2016 0940   CO2 21 (L) 10/23/2016 0940   GLUCOSE 108 (H) 10/23/2016 0940   BUN 11 10/23/2016 0940   CREATININE 0.76 10/23/2016 0940   CALCIUM 8.5 (L) 10/23/2016 0940   PROT 6.9 10/23/2016 0940   ALBUMIN 3.7 10/23/2016 0940   AST 28 10/23/2016 0940   ALT 24 10/23/2016 0940   ALKPHOS 79 10/23/2016 0940   BILITOT 0.7 10/23/2016 0940   GFRNONAA >60 10/23/2016 0940   GFRAA >60 10/23/2016 0940   PATHOLOGY: 06/21/16 Soft Tissue Needle Core Biopsy, Right lower quadrant peritoneal mass - ADENOCARCINOMA, CONSISTENT WITH COLONIC PRIMARY.  RADIOGRAPHIC STUDIES: I have personally reviewed the radiological images as listed and agreed with the findings in the  report.  CT Abdomen 03/04/2016 IMPRESSION: 1. Findings highly concerning for disease progression with interval increase in the soft tissue mass within the right lower quadrant.  2. Apparent thickening of the rectum as can be seen in the setting of proctitis.  3. Hepatic stenosis.   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 Chronic diarrhea following colectomy. Diffuse contact dermatitis due to allergy from detergent Right foot swelling  PLAN: - Dr. Crisoforo Oxford, patient's surgeon, has recommended for patient to receive about 6-8 cycles of chemotherapy with restaging prior to him resecting his retroperitoneal mass.  - Proceed with cycle 4 of FOLFOX today. Continue FOLFOX q14 days. I will order restaging PET to be performed prior to cycle 5. May add avastin if he is not having a good response after 4 cycles of chemo.  - Counseled him on continued efforts for smoke cessation and patient verbalized understanding. He has quit drinking alcohol. - RTC in 2 weeks for follow up to review PET and also chemo.   All questions were answered. The patient knows to call the clinic with any problems, questions or concerns.   This note was electronically signed by:   Twana First, MD 10/23/2016 12:25 PM

## 2016-10-23 NOTE — Progress Notes (Signed)
Continuous 5FU pump connected today per protocol.  Treatment given per orders. Patient tolerated it well without problems. Vitals stable and discharged home from clinic ambulatory. Follow up as scheduled.

## 2016-10-24 ENCOUNTER — Telehealth (HOSPITAL_COMMUNITY): Payer: Self-pay

## 2016-10-24 DIAGNOSIS — R197 Diarrhea, unspecified: Secondary | ICD-10-CM

## 2016-10-24 LAB — CEA: CEA: 7.3 ng/mL — ABNORMAL HIGH (ref 0.0–4.7)

## 2016-10-24 MED ORDER — DIPHENOXYLATE-ATROPINE 2.5-0.025 MG PO TABS: 1.0000 | ORAL_TABLET | Freq: Four times a day (QID) | ORAL | 1 refills | Status: AC | PRN

## 2016-10-24 NOTE — Telephone Encounter (Signed)
Patient called stating he was supposed to get a prescription for diarrhea sent to his pharmacy yesterday. Reviewed with MD. Prescription for lomotil sent to patients pharmacy, [facility] in [place].

## 2016-10-25 ENCOUNTER — Encounter (HOSPITAL_COMMUNITY): Payer: Self-pay

## 2016-10-25 ENCOUNTER — Encounter (HOSPITAL_BASED_OUTPATIENT_CLINIC_OR_DEPARTMENT_OTHER): Payer: BLUE CROSS/BLUE SHIELD

## 2016-10-25 VITALS — BP 116/72 | HR 90 | Temp 98.2°F | Resp 18

## 2016-10-25 DIAGNOSIS — C182 Malignant neoplasm of ascending colon: Secondary | ICD-10-CM

## 2016-10-25 MED ORDER — HEPARIN SOD (PORK) LOCK FLUSH 100 UNIT/ML IV SOLN
500.0000 [IU] | Freq: Once | INTRAVENOUS | Status: AC | PRN
  Administered 2016-10-25: 500 [IU]

## 2016-10-25 MED ORDER — SODIUM CHLORIDE 0.9% FLUSH
10.0000 mL | INTRAVENOUS | Status: DC | PRN
  Administered 2016-10-25: 10 mL
  Filled 2016-10-25: qty 10

## 2016-10-25 NOTE — Progress Notes (Signed)
Louann Liv presents to have home infusion pump d/c'd and for port-a-cath deaccess with flush. Portacath located right chest wall accessed with  H 20 needle.  Good blood return present. Portacath flushed with NS and 500U/40ml Heparin, and needle removed intact.  Procedure tolerated well and without incident.  Discharged ambulatory.

## 2016-11-06 ENCOUNTER — Encounter (HOSPITAL_COMMUNITY): Payer: BLUE CROSS/BLUE SHIELD | Attending: Oncology

## 2016-11-06 ENCOUNTER — Encounter (HOSPITAL_COMMUNITY): Payer: Self-pay

## 2016-11-06 ENCOUNTER — Encounter (HOSPITAL_COMMUNITY): Payer: BLUE CROSS/BLUE SHIELD

## 2016-11-06 VITALS — BP 115/69 | HR 63 | Temp 97.8°F | Resp 18 | Wt 190.4 lb

## 2016-11-06 DIAGNOSIS — E876 Hypokalemia: Secondary | ICD-10-CM

## 2016-11-06 DIAGNOSIS — L299 Pruritus, unspecified: Secondary | ICD-10-CM | POA: Diagnosis present

## 2016-11-06 DIAGNOSIS — Z5111 Encounter for antineoplastic chemotherapy: Secondary | ICD-10-CM

## 2016-11-06 DIAGNOSIS — C182 Malignant neoplasm of ascending colon: Secondary | ICD-10-CM

## 2016-11-06 LAB — CBC WITH DIFFERENTIAL/PLATELET
BASOS ABS: 0 10*3/uL (ref 0.0–0.1)
BASOS PCT: 1 %
Eosinophils Absolute: 0.2 10*3/uL (ref 0.0–0.7)
Eosinophils Relative: 4 %
HEMATOCRIT: 39.1 % (ref 39.0–52.0)
HEMOGLOBIN: 13.2 g/dL (ref 13.0–17.0)
Lymphocytes Relative: 24 %
Lymphs Abs: 1.1 10*3/uL (ref 0.7–4.0)
MCH: 33.8 pg (ref 26.0–34.0)
MCHC: 33.8 g/dL (ref 30.0–36.0)
MCV: 100 fL (ref 78.0–100.0)
MONO ABS: 0.9 10*3/uL (ref 0.1–1.0)
Monocytes Relative: 18 %
NEUTROS ABS: 2.5 10*3/uL (ref 1.7–7.7)
NEUTROS PCT: 53 %
Platelets: 143 10*3/uL — ABNORMAL LOW (ref 150–400)
RBC: 3.91 MIL/uL — ABNORMAL LOW (ref 4.22–5.81)
RDW: 14.9 % (ref 11.5–15.5)
WBC: 4.7 10*3/uL (ref 4.0–10.5)

## 2016-11-06 LAB — COMPREHENSIVE METABOLIC PANEL
ALBUMIN: 2.2 g/dL — AB (ref 3.5–5.0)
ALT: 19 U/L (ref 17–63)
AST: 20 U/L (ref 15–41)
Alkaline Phosphatase: 47 U/L (ref 38–126)
Anion gap: 4 — ABNORMAL LOW (ref 5–15)
BILIRUBIN TOTAL: 0.3 mg/dL (ref 0.3–1.2)
BUN: 12 mg/dL (ref 6–20)
CO2: 16 mmol/L — AB (ref 22–32)
Calcium: 5.4 mg/dL — CL (ref 8.9–10.3)
Chloride: 121 mmol/L — ABNORMAL HIGH (ref 101–111)
Creatinine, Ser: 0.47 mg/dL — ABNORMAL LOW (ref 0.61–1.24)
GFR calc Af Amer: >60 mL/min (ref >60)
GFR calc non Af Amer: >60 mL/min (ref >60)
GLUCOSE: 63 mg/dL — AB (ref 65–99)
POTASSIUM: 2.2 mmol/L — AB (ref 3.5–5.1)
SODIUM: 141 mmol/L (ref 135–145)
TOTAL PROTEIN: 4.3 g/dL — AB (ref 6.5–8.1)

## 2016-11-06 MED ORDER — DEXAMETHASONE SODIUM PHOSPHATE 10 MG/ML IJ SOLN
INTRAMUSCULAR | Status: AC
  Filled 2016-11-06: qty 1

## 2016-11-06 MED ORDER — CALCIUM CARBONATE-VITAMIN D 500-200 MG-UNIT PO TABS: 1.0000 | ORAL_TABLET | Freq: Every day | ORAL | 1 refills | Status: AC

## 2016-11-06 MED ORDER — SODIUM CHLORIDE 0.9% FLUSH
10.0000 mL | INTRAVENOUS | Status: DC | PRN
  Administered 2016-11-06: 10 mL
  Filled 2016-11-06: qty 10

## 2016-11-06 MED ORDER — DEXAMETHASONE SODIUM PHOSPHATE 10 MG/ML IJ SOLN
10.0000 mg | Freq: Once | INTRAMUSCULAR | Status: AC
  Administered 2016-11-06: 10 mg via INTRAVENOUS

## 2016-11-06 MED ORDER — SODIUM CHLORIDE 0.9 % IV SOLN
2375.0000 mg/m2 | INTRAVENOUS | Status: DC
  Administered 2016-11-06: 5000 mg via INTRAVENOUS
  Filled 2016-11-06: qty 100

## 2016-11-06 MED ORDER — POTASSIUM CHLORIDE CRYS ER 20 MEQ PO TBCR: 40.0000 meq | EXTENDED_RELEASE_TABLET | Freq: Every day | ORAL | 1 refills | Status: AC

## 2016-11-06 MED ORDER — POTASSIUM CHLORIDE CRYS ER 20 MEQ PO TBCR
60.0000 meq | EXTENDED_RELEASE_TABLET | Freq: Two times a day (BID) | ORAL | Status: DC
  Administered 2016-11-06: 60 meq via ORAL

## 2016-11-06 MED ORDER — POTASSIUM CHLORIDE CRYS ER 20 MEQ PO TBCR
60.0000 meq | EXTENDED_RELEASE_TABLET | Freq: Two times a day (BID) | ORAL | Status: DC
  Administered 2016-11-06: 60 meq via ORAL
  Filled 2016-11-06: qty 3

## 2016-11-06 MED ORDER — PALONOSETRON HCL INJECTION 0.25 MG/5ML
0.2500 mg | Freq: Once | INTRAVENOUS | Status: AC
  Administered 2016-11-06: 0.25 mg via INTRAVENOUS

## 2016-11-06 MED ORDER — DEXTROSE 5 % IV SOLN
Freq: Once | INTRAVENOUS | Status: AC
  Administered 2016-11-06: 11:00:00 via INTRAVENOUS

## 2016-11-06 MED ORDER — PALONOSETRON HCL INJECTION 0.25 MG/5ML
INTRAVENOUS | Status: AC
  Filled 2016-11-06: qty 5

## 2016-11-06 MED ORDER — POTASSIUM CHLORIDE CRYS ER 20 MEQ PO TBCR
EXTENDED_RELEASE_TABLET | ORAL | Status: AC
  Filled 2016-11-06: qty 3

## 2016-11-06 MED ORDER — LEUCOVORIN CALCIUM INJECTION 350 MG
403.0000 mg/m2 | Freq: Once | INTRAVENOUS | Status: AC
  Administered 2016-11-06: 850 mg via INTRAVENOUS
  Filled 2016-11-06: qty 10

## 2016-11-06 MED ORDER — OXALIPLATIN CHEMO INJECTION 100 MG/20ML
85.0000 mg/m2 | Freq: Once | INTRAVENOUS | Status: AC
  Administered 2016-11-06: 180 mg via INTRAVENOUS
  Filled 2016-11-06: qty 36

## 2016-11-06 MED ORDER — FLUOROURACIL CHEMO INJECTION 2.5 GM/50ML
400.0000 mg/m2 | Freq: Once | INTRAVENOUS | Status: AC
  Administered 2016-11-06: 850 mg via INTRAVENOUS
  Filled 2016-11-06: qty 17

## 2016-11-06 NOTE — Patient Instructions (Signed)
[facility] Discharge Instructions for Patients Receiving Chemotherapy  Today you received the following chemotherapy agents oxaliplatin, leucovorin, and adruicil.    To help prevent nausea and vomiting after your treatment, we encourage you to take your nausea medication.     If you develop nausea and vomiting that is not controlled by your nausea medication, call the clinic.   BELOW ARE SYMPTOMS THAT SHOULD BE REPORTED IMMEDIATELY:  *FEVER GREATER THAN 100.5 F  *CHILLS WITH OR WITHOUT FEVER  NAUSEA AND VOMITING THAT IS NOT CONTROLLED WITH YOUR NAUSEA MEDICATION  *UNUSUAL SHORTNESS OF BREATH  *UNUSUAL BRUISING OR BLEEDING  TENDERNESS IN MOUTH AND THROAT WITH OR WITHOUT PRESENCE OF ULCERS  *URINARY PROBLEMS  *BOWEL PROBLEMS  UNUSUAL RASH Items with * indicate a potential emergency and should be followed up as soon as possible.  Feel free to call the clinic should you have any questions or concerns. The clinic phone number is [phone].  Please show the [facility] at check-in to the Emergency Department and triage nurse.

## 2016-11-06 NOTE — Progress Notes (Signed)
CRITICAL VALUE ALERT Critical value received:  K - 2.2  Date of notification:  11/06/16 Time of notification: 1751 Critical value read back:  Yes.   Nurse who received alert:  M.Jacklin Zwick, LPN MD notified (1st page):  L.Talbert Cage, MD  CRITICAL VALUE ALERT Critical value received:  Cal - 5.4 Date of notification:  11/06/16 Time of notification: 0258 Critical value read back:  Yes.   Nurse who received alert:  M.Imonie Tuch, LPN MD notified (1st page):  L.Talbert Cage, MD

## 2016-11-06 NOTE — Progress Notes (Signed)
To treatment area for labs and chemotherapy.  No oncology follow up today per Dr. Talbert Cage because he needs a PET Ct before next follow up.  Exam scheduled for November 15, 2016 and patient made aware.    Labs reviewed with Dr. Talbert Cage with orders received. Potassium 2.2, Calcium 5.4, Albumin 2.2.  Patient to receive Potassium 63meq by mouth now and on discharge.  Prescription for potassium and oscal D sent to his pharmacy by Jaynie Collins, LPN.  Ok to treat today per Dr. Talbert Cage.    Patient tolerated chemotherapy with no complaints voiced.  Port site clean and dry with no bruising or swelling noted.  Tegaderm dressing intact and 5FU pump infusing with no alarms or complaints from the patient.  VSS with discharge and left ambulatory.  No s/s of distress.

## 2016-11-08 ENCOUNTER — Encounter (HOSPITAL_BASED_OUTPATIENT_CLINIC_OR_DEPARTMENT_OTHER): Payer: BLUE CROSS/BLUE SHIELD

## 2016-11-08 VITALS — BP 114/71 | HR 95 | Temp 98.1°F | Resp 16

## 2016-11-08 DIAGNOSIS — Z452 Encounter for adjustment and management of vascular access device: Secondary | ICD-10-CM

## 2016-11-08 DIAGNOSIS — C182 Malignant neoplasm of ascending colon: Secondary | ICD-10-CM | POA: Diagnosis not present

## 2016-11-08 MED ORDER — HEPARIN SOD (PORK) LOCK FLUSH 100 UNIT/ML IV SOLN
500.0000 [IU] | Freq: Once | INTRAVENOUS | Status: AC | PRN
  Administered 2016-11-08: 500 [IU]
  Filled 2016-11-08: qty 5

## 2016-11-08 MED ORDER — SODIUM CHLORIDE 0.9% FLUSH
10.0000 mL | INTRAVENOUS | Status: DC | PRN
  Administered 2016-11-08: 10 mL
  Filled 2016-11-08: qty 10

## 2016-11-08 NOTE — Progress Notes (Signed)
Patients 5FU pump discontinued with no complaints over the 46 hour infusion.  Port site clean and dry.  Flushed per protocol with no complaints of pain.  No bruising or swelling noted and band aid applied.  VSS with discharge and left ambulatory.  No s/s of distress noted.

## 2016-11-08 NOTE — Patient Instructions (Signed)
[facility] at [facility]  Discharge Instructions:  Your 5FU pump was discontinued today.  Keep PET CT as directed and return as scheduled for next appointment.  _______________________________________________________________  Thank you for choosing [facility] at [facility] to provide your oncology and hematology care.  To afford each patient quality time with our providers, please arrive at least 15 minutes before your scheduled appointment.  You need to re-schedule your appointment if you arrive 10 or more minutes late.  We strive to give you quality time with our providers, and arriving late affects you and other patients whose appointments are after yours.  Also, if you no show three or more times for appointments you may be dismissed from the clinic.  Again, thank you for choosing [place] at [facility] hope is that these requests will allow you access to exceptional care and in a timely manner. _______________________________________________________________  If you have questions after your visit, please contact our office at [phone] between the hours of 8:30 a.m. and 5:00 p.m. Voicemails left after 4:30 p.m. will not be returned until the following business day. _______________________________________________________________  For prescription refill requests, have your pharmacy contact our office. _______________________________________________________________  Recommendations made by the consultant and any test results will be sent to your referring physician. _______________________________________________________________

## 2016-11-15 ENCOUNTER — Ambulatory Visit (HOSPITAL_COMMUNITY): Admission: RE | Admit: 2016-11-15 | Payer: BLUE CROSS/BLUE SHIELD | Source: Ambulatory Visit

## 2016-11-16 ENCOUNTER — Ambulatory Visit (HOSPITAL_COMMUNITY)
Admission: RE | Admit: 2016-11-16 | Discharge: 2016-11-16 | Disposition: A | Discharge status: Home / Self Care | Payer: BLUE CROSS/BLUE SHIELD | Source: Ambulatory Visit | Attending: Oncology | Admitting: Oncology

## 2016-11-16 DIAGNOSIS — K469 Unspecified abdominal hernia without obstruction or gangrene: Secondary | ICD-10-CM | POA: Insufficient documentation

## 2016-11-16 DIAGNOSIS — I700 Atherosclerosis of aorta: Secondary | ICD-10-CM | POA: Insufficient documentation

## 2016-11-16 DIAGNOSIS — R1903 Right lower quadrant abdominal swelling, mass and lump: Secondary | ICD-10-CM | POA: Diagnosis not present

## 2016-11-16 DIAGNOSIS — K4020 Bilateral inguinal hernia, without obstruction or gangrene, not specified as recurrent: Secondary | ICD-10-CM | POA: Insufficient documentation

## 2016-11-16 DIAGNOSIS — C182 Malignant neoplasm of ascending colon: Secondary | ICD-10-CM | POA: Diagnosis present

## 2016-11-16 DIAGNOSIS — R1909 Other intra-abdominal and pelvic swelling, mass and lump: Secondary | ICD-10-CM | POA: Diagnosis not present

## 2016-11-16 LAB — GLUCOSE, CAPILLARY: GLUCOSE-CAPILLARY: 94 mg/dL (ref 65–99)

## 2016-11-16 MED ORDER — FLUDEOXYGLUCOSE F - 18 (FDG) INJECTION
9.5000 | Freq: Once | INTRAVENOUS | Status: AC | PRN
  Administered 2016-11-16: 9.5 via INTRAVENOUS

## 2016-11-19 ENCOUNTER — Other Ambulatory Visit (HOSPITAL_COMMUNITY): Payer: Self-pay | Admitting: Oncology

## 2016-11-20 ENCOUNTER — Encounter (HOSPITAL_BASED_OUTPATIENT_CLINIC_OR_DEPARTMENT_OTHER): Payer: BLUE CROSS/BLUE SHIELD | Admitting: Oncology

## 2016-11-20 ENCOUNTER — Encounter (HOSPITAL_COMMUNITY): Payer: Self-pay

## 2016-11-20 ENCOUNTER — Encounter (HOSPITAL_BASED_OUTPATIENT_CLINIC_OR_DEPARTMENT_OTHER): Payer: BLUE CROSS/BLUE SHIELD

## 2016-11-20 VITALS — BP 133/79 | HR 87 | Temp 98.4°F | Resp 18

## 2016-11-20 VITALS — BP 140/91 | HR 52 | Temp 97.7°F | Resp 20 | Ht 71.5 in | Wt 190.0 lb

## 2016-11-20 DIAGNOSIS — K5909 Other constipation: Secondary | ICD-10-CM

## 2016-11-20 DIAGNOSIS — R918 Other nonspecific abnormal finding of lung field: Secondary | ICD-10-CM | POA: Diagnosis not present

## 2016-11-20 DIAGNOSIS — R1900 Intra-abdominal and pelvic swelling, mass and lump, unspecified site: Secondary | ICD-10-CM

## 2016-11-20 DIAGNOSIS — C182 Malignant neoplasm of ascending colon: Secondary | ICD-10-CM | POA: Diagnosis not present

## 2016-11-20 DIAGNOSIS — Z5112 Encounter for antineoplastic immunotherapy: Secondary | ICD-10-CM

## 2016-11-20 DIAGNOSIS — Z720 Tobacco use: Secondary | ICD-10-CM

## 2016-11-20 DIAGNOSIS — Z5111 Encounter for antineoplastic chemotherapy: Secondary | ICD-10-CM | POA: Diagnosis not present

## 2016-11-20 DIAGNOSIS — C801 Malignant (primary) neoplasm, unspecified: Principal | ICD-10-CM

## 2016-11-20 DIAGNOSIS — C786 Secondary malignant neoplasm of retroperitoneum and peritoneum: Secondary | ICD-10-CM

## 2016-11-20 DIAGNOSIS — R21 Rash and other nonspecific skin eruption: Secondary | ICD-10-CM

## 2016-11-20 LAB — CBC WITH DIFFERENTIAL/PLATELET
BASOS ABS: 0 10*3/uL (ref 0.0–0.1)
BASOS PCT: 0 %
EOS ABS: 0.3 10*3/uL (ref 0.0–0.7)
Eosinophils Relative: 4 %
HEMATOCRIT: 39.9 % (ref 39.0–52.0)
HEMOGLOBIN: 13.9 g/dL (ref 13.0–17.0)
LYMPHS ABS: 0.9 10*3/uL (ref 0.7–4.0)
Lymphocytes Relative: 13 %
MCH: 34.2 pg — ABNORMAL HIGH (ref 26.0–34.0)
MCHC: 34.8 g/dL (ref 30.0–36.0)
MCV: 98 fL (ref 78.0–100.0)
MONO ABS: 1 10*3/uL (ref 0.1–1.0)
MONOS PCT: 15 %
NEUTROS ABS: 4.3 10*3/uL (ref 1.7–7.7)
NEUTROS PCT: 68 %
Platelets: 122 10*3/uL — ABNORMAL LOW (ref 150–400)
RBC: 4.07 MIL/uL — ABNORMAL LOW (ref 4.22–5.81)
RDW: 14.7 % (ref 11.5–15.5)
WBC: 6.5 10*3/uL (ref 4.0–10.5)

## 2016-11-20 LAB — URINALYSIS, DIPSTICK ONLY
BILIRUBIN URINE: NEGATIVE
GLUCOSE, UA: NEGATIVE mg/dL
HGB URINE DIPSTICK: NEGATIVE
Ketones, ur: NEGATIVE mg/dL
Leukocytes, UA: NEGATIVE
Nitrite: NEGATIVE
PROTEIN: NEGATIVE mg/dL
Specific Gravity, Urine: 1.015 (ref 1.005–1.030)
pH: 5.5 (ref 5.0–8.0)

## 2016-11-20 LAB — COMPREHENSIVE METABOLIC PANEL
ALK PHOS: 102 U/L (ref 38–126)
ALT: 17 U/L (ref 17–63)
AST: 27 U/L (ref 15–41)
Albumin: 3.1 g/dL — ABNORMAL LOW (ref 3.5–5.0)
Anion gap: 14 (ref 5–15)
BILIRUBIN TOTAL: 0.6 mg/dL (ref 0.3–1.2)
BUN: 5 mg/dL — AB (ref 6–20)
CALCIUM: 8.4 mg/dL — AB (ref 8.9–10.3)
CO2: 22 mmol/L (ref 22–32)
CREATININE: 0.82 mg/dL (ref 0.61–1.24)
Chloride: 96 mmol/L — ABNORMAL LOW (ref 101–111)
GFR calc Af Amer: >60 mL/min (ref >60)
GLUCOSE: 201 mg/dL — AB (ref 65–99)
POTASSIUM: 3.3 mmol/L — AB (ref 3.5–5.1)
Sodium: 132 mmol/L — ABNORMAL LOW (ref 135–145)
TOTAL PROTEIN: 6.5 g/dL (ref 6.5–8.1)

## 2016-11-20 MED ORDER — DEXTROSE 5 % IV SOLN
Freq: Once | INTRAVENOUS | Status: AC
  Administered 2016-11-20: 10:00:00 via INTRAVENOUS

## 2016-11-20 MED ORDER — FLUOROURACIL CHEMO INJECTION 2.5 GM/50ML
400.0000 mg/m2 | Freq: Once | INTRAVENOUS | Status: AC
  Administered 2016-11-20: 850 mg via INTRAVENOUS
  Filled 2016-11-20: qty 17

## 2016-11-20 MED ORDER — PALONOSETRON HCL INJECTION 0.25 MG/5ML
INTRAVENOUS | Status: AC
  Filled 2016-11-20: qty 5

## 2016-11-20 MED ORDER — PALONOSETRON HCL INJECTION 0.25 MG/5ML
0.2500 mg | Freq: Once | INTRAVENOUS | Status: AC
  Administered 2016-11-20: 0.25 mg via INTRAVENOUS

## 2016-11-20 MED ORDER — BEVACIZUMAB CHEMO INJECTION 400 MG/16ML
4.5000 mg/kg | Freq: Once | INTRAVENOUS | Status: AC
  Administered 2016-11-20: 400 mg via INTRAVENOUS
  Filled 2016-11-20: qty 16

## 2016-11-20 MED ORDER — DEXAMETHASONE SODIUM PHOSPHATE 10 MG/ML IJ SOLN
10.0000 mg | Freq: Once | INTRAMUSCULAR | Status: AC
  Administered 2016-11-20: 10 mg via INTRAVENOUS

## 2016-11-20 MED ORDER — DEXAMETHASONE SODIUM PHOSPHATE 10 MG/ML IJ SOLN
INTRAMUSCULAR | Status: AC
  Filled 2016-11-20: qty 1

## 2016-11-20 MED ORDER — SODIUM CHLORIDE 0.9 % IV SOLN
Freq: Once | INTRAVENOUS | Status: AC
  Administered 2016-11-20: 13:00:00 via INTRAVENOUS

## 2016-11-20 MED ORDER — SODIUM CHLORIDE 0.9 % IV SOLN
2375.0000 mg/m2 | INTRAVENOUS | Status: DC
  Administered 2016-11-20: 5000 mg via INTRAVENOUS
  Filled 2016-11-20: qty 100

## 2016-11-20 MED ORDER — POTASSIUM CHLORIDE CRYS ER 20 MEQ PO TBCR
40.0000 meq | EXTENDED_RELEASE_TABLET | Freq: Once | ORAL | Status: AC
  Administered 2016-11-20: 40 meq via ORAL
  Filled 2016-11-20: qty 2

## 2016-11-20 MED ORDER — OXALIPLATIN CHEMO INJECTION 100 MG/20ML
85.0000 mg/m2 | Freq: Once | INTRAVENOUS | Status: AC
  Administered 2016-11-20: 180 mg via INTRAVENOUS
  Filled 2016-11-20: qty 36

## 2016-11-20 MED ORDER — DEXTROSE 5 % IV SOLN
403.0000 mg/m2 | Freq: Once | INTRAVENOUS | Status: AC
  Administered 2016-11-20: 850 mg via INTRAVENOUS
  Filled 2016-11-20: qty 35

## 2016-11-20 NOTE — Progress Notes (Signed)
Tolerated infusions w/o adverse reaction.  Alert, in no distress.  VSS.  Discharged ambulatory in c/o family.  

## 2016-11-20 NOTE — Progress Notes (Signed)
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 Negative MRI appearance of the brain.      06/08/2016 PET scan    IMPRESSION: 1. 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 2. Difficult to exclude additional small foci of peritoneal tumor in the pelvis. Small asymmetric right inguinal and external iliac nodes are mildly hypermetabolic, nonspecific. Correlation with prior imaging results recommended. 3. No evidence of hepatic or thoracic metastatic disease. 4. Subacute rib fractures on the left with associated metabolic activity. 5. Mild esophageal activity, possibly esophagitis. 6. Ventral and left inguinal hernias containing colon. Hepatic steatosis.       06/21/2016 Pathology Results    Soft Tissue Needle Core Biopsy, Right lower quadrant peritoneal mass - ADENOCARCINOMA, CONSISTENT WITH COLONIC PRIMARY.      HISTORY OF PRESENTING ILLNESS:  Edward Graham 56 y.o. male is here because of pT4a, N0 right sided colon cancer with a questionable 8.8 cm mass separate from the primary grouped with the omentum diagnosed 10/2014. He initially presented with right sided abdominal pain and complaint that his right flank was becoming progressively erythematous and edematous. Patient was previously being followed by Dr. Vallarie Mare of [facility] in [place].  He presented to the ED on 10/26/2014 with abdominal pain, a CT showed suspicion for right cecal mass with  perforation and decompression into the subcutaneous fat of the right lower abdominal wall and of the right pelvic side wall. He underwent a right hemicolectomy and debulking on 11/30/2014 and low grade mucinous adenocarcinoma was found without definitive invasion of neighboring organs. 33 LN were removed and all were negative for malignancy. Radial margins were unable to be evaluated due to fragmented specimen and perforation. There was a separate 8.8 cm tumor of the omentum was found as well, it was unclear whether this was an implant or a fragmented primary. The omentum itself was negative. He had multiple surgical would infections requiring debridement following his hemicolectomy. He was discharged on 11/12/14. Prior to starting adjuvant therapy he had baseline scans performed which were negative for metastatic disease.  He has received adjuvant CAPEOX, with progression seen on scans in the RLQ mass. He has moved to the [place] area and is transferring care. He was last seen at [facility] in [place] on 03/02/2016. He had a biopsy scheduled of the RLQ abdominal mass in march 2018, but did not show up.    He presents with his father, who is taking care of his medical care. He has not been doing well. He states that since his colectomy, he has a lot of diarrhea, about 15-20 bowel movements a day, without relief for Imodium. 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 He also has chronic vertigo/balance issues. Now he has a new onset rash. His sister washed his clothes in a new detergent and now he has a rash on his whole body. He has a history of eczema and the rash has now plaqued over. He tried hydrocortisone cream, but it  did not help. The rash is painful. Since January, he has not lost any weight. He uses an inhaler for allergies. The skin on the bottoms of his feet are cracking and painful. He reports leg swelling and abdominal pain. Denies dehydration, headaches, chest  pain, SOB, or any other concerns.   INTERVAL HISTORY: Edward Graham presents to the clinic today for follow up. He is accompanied by his father today.  Patient presents today for continued follow-up of his colon cancer and cycle 6 of chemo. He states that with his last cycle he has been having more fatigue. He also had nausea/vomiting starting day 4 post chemo. He continues to have his chronic diarrhea however it has not been worsened by chemotherapy. He continues to take anti-diarrhea medication and it helps to control his symptoms. He states his chronic diffuse rash is a little worse this time. He states his appetite is good, but nothing tastes good. He has started drinking alcohol again but not as much as before. He continues to smoke roughly 15 cigarettes a day.Marland Kitchen   MEDICAL HISTORY:  Past Medical History:  Diagnosis Date  . Allergy   . Anxiety   . Colon cancer ([place])   . Depression   . Neuromuscular disorder [facility])     SURGICAL HISTORY: Past Surgical History:  Procedure Laterality Date  . COLON RESECTION    . IR FLUORO GUIDE PORT INSERTION RIGHT  06/01/2016  . IR US GUIDE VASC ACCESS RIGHT  06/01/2016    SOCIAL HISTORY: Social History   Social History  . Marital status: Divorced    Spouse name: N/A  . Number of children: N/A  . Years of education: N/A   Occupational History  . Not on file.   Social History Main Topics  . Smoking status: Current Every Day Smoker    Packs/day: 1.00    Years: 40.00    Types: Cigarettes  . Smokeless tobacco: Never Used  . Alcohol use Yes     Comment: 6 pack a week  . Drug use: No  . Sexual activity: Not on file     Comment: divorced   Other Topics Concern  . Not on file   Social History Narrative  . No narrative on file    FAMILY HISTORY: Family History  Problem Relation Age of Onset  . Emphysema Mother   . Asthma Sister     ALLERGIES:  is allergic to codeine; dust mite extract; mold extract [trichophyton]; and tree  extract.  MEDICATIONS:  Current Outpatient Prescriptions  Medication Sig Dispense Refill  . albuterol (PROVENTIL HFA;VENTOLIN HFA) 108 (90 Base) MCG/ACT inhaler Inhale 2 puffs into the lungs every 6 (six) hours as needed for wheezing or shortness of breath. 1 Inhaler 2  . calcium-vitamin D (OSCAL WITH D) 500-200 MG-UNIT tablet Take 1 tablet by mouth daily with breakfast. 90 tablet 1  . dexamethasone (DECADRON) 4 MG tablet Take 2 tablets (8 mg total) by mouth daily. Start the day after chemotherapy for 2 days. Take with food. 30 tablet 1  . diphenoxylate-atropine (LOMOTIL) 2.5-0.025 MG tablet Take 1 tablet by mouth 4 (four) times daily as needed for diarrhea or loose stools. 30 tablet 1  . fluorouracil CALGB 16109 in sodium chloride 0.9 % 150 mL Inject into the vein. Over 46 hours    . HYDROcodone-acetaminophen (NORCO/VICODIN) 5-325 MG tablet Take 1 tablet by mouth every 6 (six) hours as needed for moderate pain.    . hydrOXYzine (ATARAX/VISTARIL) 25 MG tablet Take  1 tablet (25 mg total) by mouth every 6 (six) hours as needed for itching. 30 tablet 0  . LEUCOVORIN CALCIUM IV Inject into the vein. Every 2 weeks    . levocetirizine (XYZAL) 5 MG tablet Take 5 mg by mouth every evening.     . lidocaine-prilocaine (EMLA) cream Apply to affected area once 30 g 3  . Multiple Vitamin (THERA) TABS Take 1 tablet by mouth daily.     . mupirocin ointment (BACTROBAN) 2 % APPLY TO AFFECTED AREA AND INSIDE NOSE UP TO 3 TIMES A DAY AS NEEDED  99  . ondansetron (ZOFRAN) 8 MG tablet Take 1 tablet (8 mg total) by mouth 2 (two) times daily as needed for refractory nausea / vomiting. Start on day 3 after chemotherapy. 30 tablet 1  . OXALIPLATIN IV Inject into the vein. Every 2 weeks    . Potassium 99 MG TABS Take 400 mg by mouth daily.    . potassium chloride SA (K-DUR,KLOR-CON) 20 MEQ tablet Take 2 tablets (40 mEq total) by mouth daily. 60 tablet 1  . predniSONE (DELTASONE) 20 MG tablet 3 tabs po daily x 3 days,  then 2 tabs x 3 days, then 1.5 tabs x 3 days, then 1 tab x 3 days, then 0.5 tabs x 3 days 27 tablet 0  . prochlorperazine (COMPAZINE) 10 MG tablet Take 1 tablet (10 mg total) by mouth every 6 (six) hours as needed (Nausea or vomiting). 30 tablet 1  . triamcinolone cream (KENALOG) 0.1 % APPLY TO AFFECTED AREA UP TO TWICE A DAY AS NEEDED (NOT TO FACE, GROIN, OR UNDERARMS)  3   No current facility-administered medications for this visit.    Review of Systems  Constitutional: Positive for malaise/fatigue. Negative for weight loss.       Good appetite   HENT: Negative.   Eyes: Negative.   Respiratory: Negative.  Negative for shortness of breath.   Cardiovascular: Negative for chest pain and leg swelling.  Gastrointestinal: Positive for diarrhea. Negative for abdominal pain.  Genitourinary: Negative.   Musculoskeletal: Negative.   Skin: Positive for rash (whole body, a little worse).  Neurological: Positive for tingling (hands and feet, stable). Negative for headaches.  Endo/Heme/Allergies: Negative.   Psychiatric/Behavioral: Negative.   All other systems reviewed and are negative. 14 point ROS was done and is otherwise as detailed above or in HPI  PHYSICAL EXAMINATION: ECOG PERFORMANCE STATUS: 1 - Symptomatic but completely ambulatory  Physical Exam  Constitutional: He is oriented to person, place, and time and well-developed, well-nourished, and in no distress.  HENT:  Head: Normocephalic and atraumatic.  Mouth/Throat: Oropharynx is clear and moist.  Eyes: Pupils are equal, round, and reactive to light. Conjunctivae and EOM are normal.  Neck: Normal range of motion. Neck supple.  Cardiovascular: Normal rate, regular rhythm and normal heart sounds.  Exam reveals no gallop and no friction rub.   No murmur heard. Pulmonary/Chest: Effort normal and breath sounds normal. No respiratory distress. He has no wheezes (scattered wheezes). He has no rales. He exhibits no tenderness.  Abdominal:  Soft. Bowel sounds are normal. He exhibits no distension. There is no tenderness. There is no rebound and no guarding.  2 large abdominal hernias, nontender, large surgical scars well healed  Musculoskeletal: Normal range of motion. He exhibits no edema.  Neurological: He is alert and oriented to person, place, and time. Gait normal.  Skin: Skin is warm and dry. No rash noted. No erythema.  Diffuse erythematous rash on  arms and back tremendously. Some eczema.   Nursing note and vitals reviewed.  LABORATORY DATA:  I have reviewed the data as listed Lab Results  Component Value Date   WBC 4.7 11/06/2016   HGB 13.2 11/06/2016   HCT 39.1 11/06/2016   MCV 100.0 11/06/2016   PLT 143 (L) 11/06/2016   CMP     Component Value Date/Time   NA 141 11/06/2016 0928   K 2.2 (LL) 11/06/2016 0928   CL 121 (H) 11/06/2016 0928   CO2 16 (L) 11/06/2016 0928   GLUCOSE 63 (L) 11/06/2016 0928   BUN 12 11/06/2016 0928   CREATININE 0.47 (L) 11/06/2016 0928   CALCIUM 5.4 (LL) 11/06/2016 0928   PROT 4.3 (L) 11/06/2016 0928   ALBUMIN 2.2 (L) 11/06/2016 0928   AST 20 11/06/2016 0928   ALT 19 11/06/2016 0928   ALKPHOS 47 11/06/2016 0928   BILITOT 0.3 11/06/2016 0928   GFRNONAA >60 11/06/2016 0928   GFRAA >60 11/06/2016 0928   PATHOLOGY: 06/21/16 Soft Tissue Needle Core Biopsy, Right lower quadrant peritoneal mass - ADENOCARCINOMA, CONSISTENT WITH COLONIC PRIMARY.  RADIOGRAPHIC STUDIES: I have personally reviewed the radiological images as listed and agreed with the findings in the report.  CT Abdomen 03/04/2016 IMPRESSION: 1. Findings highly concerning for disease progression with interval increase in the soft tissue mass within the right lower quadrant.  2. Apparent thickening of the rectum as can be seen in the setting of proctitis.  3. Hepatic stenosis.   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 Chronic diarrhea following colectomy. Diffuse contact dermatitis due to allergy from detergent Right foot swelling  PLAN: -Reviewed patient's restaging PET with him in detail. Unfortunately the hypermetabolic right lower quadrant 8.4 x 6.1 cm heterogeneous partially calcified mass with max SUV 5.4, previously 6.9 x 4.8 cm with max SUV 7.4, increased in size and mildly decreased in Metabolism. New hypermetabolic 1.8 cm right peritoneal nodule just inferior to the right liver lobe with max SUV 11.6. Otherwise no new areas of disease. -Discussed adding Avastin to his FOLFOX treatments starting with his cycle 6 treatment today. Discussed side effects of avastin in detail with him and he is agreeable to proceeding.  - Dr. Crisoforo Oxford, patient's surgeon, has recommended for patient to receive about 6-8 cycles of chemotherapy with restaging prior to him resecting his retroperitoneal mass. Unsure if this will occur since patient has new disease with the peritoneal nodule.  - Plan to restage after cycle 12 of FOLFOX + Avastin. Continue FOLFOX q14 days.  - Counseled him on continued efforts for smoke cessation and patient verbalized understanding. Encouraged him to continue efforts for alcohol cessation. - RTC in 4 weeks for follow up and chemo q2weeks   All questions were answered. The patient knows to call the clinic with any problems, questions or concerns.   This note was electronically signed by:   Twana First, MD 11/20/2016 8:54 AM

## 2016-11-21 LAB — CEA: CEA1: 6.8 ng/mL — AB (ref 0.0–4.7)

## 2016-11-22 ENCOUNTER — Encounter (HOSPITAL_COMMUNITY): Payer: Self-pay

## 2016-11-22 ENCOUNTER — Encounter (HOSPITAL_BASED_OUTPATIENT_CLINIC_OR_DEPARTMENT_OTHER): Payer: BLUE CROSS/BLUE SHIELD

## 2016-11-22 VITALS — BP 155/77 | HR 88 | Temp 98.6°F | Resp 16

## 2016-11-22 DIAGNOSIS — Z452 Encounter for adjustment and management of vascular access device: Secondary | ICD-10-CM | POA: Diagnosis not present

## 2016-11-22 DIAGNOSIS — C182 Malignant neoplasm of ascending colon: Secondary | ICD-10-CM

## 2016-11-22 MED ORDER — SODIUM CHLORIDE 0.9% FLUSH
10.0000 mL | INTRAVENOUS | Status: DC | PRN
  Administered 2016-11-22: 10 mL
  Filled 2016-11-22: qty 10

## 2016-11-22 MED ORDER — HEPARIN SOD (PORK) LOCK FLUSH 100 UNIT/ML IV SOLN
500.0000 [IU] | Freq: Once | INTRAVENOUS | Status: AC | PRN
  Administered 2016-11-22: 500 [IU]

## 2016-11-22 NOTE — Patient Instructions (Signed)
[place] at [facility] Discharge Instructions  RECOMMENDATIONS MADE BY THE CONSULTANT AND ANY TEST RESULTS WILL BE SENT TO YOUR REFERRING PHYSICIAN.  Deaccessed continuous 5FU pump. Follow up as scheduled.  Thank you for choosing [place] at [facility] to provide your oncology and hematology care.  To afford each patient quality time with our provider, please arrive at least 15 minutes before your scheduled appointment time.    If you have a lab appointment with the [facility] please come in thru the  Main Entrance and check in at the main information desk  You need to re-schedule your appointment should you arrive 10 or more minutes late.  We strive to give you quality time with our providers, and arriving late affects you and other patients whose appointments are after yours.  Also, if you no show three or more times for appointments you may be dismissed from the clinic at the providers discretion.     Again, thank you for choosing [facility].  Our hope is that these requests will decrease the amount of time that you wait before being seen by our physicians.       _____________________________________________________________  Should you have questions after your visit to [facility], please contact our office at [phone] between the hours of 8:30 a.m. and 4:30 p.m.  Voicemails left after 4:30 p.m. will not be returned until the following business day.  For prescription refill requests, have your pharmacy contact our office.       Resources For Cancer Patients and their Caregivers ? American Cancer Society: Can assist with transportation, wigs, general needs, runs Look Good Feel Better.        (515) 148-1150 ? Cancer Care: Provides financial assistance, online support groups, medication/co-pay assistance.  1-800-813-HOPE (808)861-3961) ? [facility] Assists [place] cancer  patients and their families through emotional , educational and financial support.  (901)506-9636 ? [place] DSS Where to apply for food stamps, Medicaid and utility assistance. 912-488-8181 ? RCATS: Transportation to medical appointments. (905) 479-1951 ? Social Security Administration: May apply for disability if have a Stage IV cancer. 914-262-3576 551-077-0399 ? [employer], Disability and Transit Services: Assists with nutrition, care and transit needs. [place] Support Programs: @10RELATIVEDAYS @ > Cancer Support Group  2nd Tuesday of the month 1pm-2pm, Journey Room  > Creative Journey  3rd Tuesday of the month 1130am-1pm, Journey Room  > Look Good Feel Better  1st Wednesday of the month 10am-12 noon, Journey Room (Call [place] to register [phone])

## 2016-11-22 NOTE — Progress Notes (Signed)
Edward Graham returns today for port de access and flush after 46 hr continous infusion of 60fu. Tolerated infusion without problems. Portacath located right chest wall was  deaccessed and flushed with 60ml NS and 500U/70ml Heparin and needle removed intact.  Procedure without incident. Patient tolerated procedure well.  Treatment given per orders. Patient tolerated it well without problems. Vitals stable and discharged home from clinic ambulatory. Follow up as scheduled.

## 2016-12-04 ENCOUNTER — Encounter (HOSPITAL_BASED_OUTPATIENT_CLINIC_OR_DEPARTMENT_OTHER): Payer: BLUE CROSS/BLUE SHIELD

## 2016-12-04 ENCOUNTER — Encounter (HOSPITAL_COMMUNITY): Payer: Self-pay

## 2016-12-04 VITALS — BP 114/72 | HR 98 | Temp 98.0°F | Resp 18 | Wt 184.0 lb

## 2016-12-04 DIAGNOSIS — Z5111 Encounter for antineoplastic chemotherapy: Secondary | ICD-10-CM

## 2016-12-04 DIAGNOSIS — C182 Malignant neoplasm of ascending colon: Secondary | ICD-10-CM | POA: Diagnosis not present

## 2016-12-04 DIAGNOSIS — Z5112 Encounter for antineoplastic immunotherapy: Secondary | ICD-10-CM | POA: Diagnosis not present

## 2016-12-04 LAB — COMPREHENSIVE METABOLIC PANEL
ALBUMIN: 3.4 g/dL — AB (ref 3.5–5.0)
ALK PHOS: 147 U/L — AB (ref 38–126)
ALT: 23 U/L (ref 17–63)
AST: 38 U/L (ref 15–41)
Anion gap: 11 (ref 5–15)
BILIRUBIN TOTAL: 0.5 mg/dL (ref 0.3–1.2)
CALCIUM: 8.3 mg/dL — AB (ref 8.9–10.3)
CO2: 22 mmol/L (ref 22–32)
CREATININE: 0.66 mg/dL (ref 0.61–1.24)
Chloride: 96 mmol/L — ABNORMAL LOW (ref 101–111)
GFR calc Af Amer: >60 mL/min (ref >60)
GFR calc non Af Amer: >60 mL/min (ref >60)
GLUCOSE: 119 mg/dL — AB (ref 65–99)
Potassium: 3.3 mmol/L — ABNORMAL LOW (ref 3.5–5.1)
SODIUM: 129 mmol/L — AB (ref 135–145)
Total Protein: 7 g/dL (ref 6.5–8.1)

## 2016-12-04 LAB — CBC WITH DIFFERENTIAL/PLATELET
Basophils Absolute: 0 10*3/uL (ref 0.0–0.1)
Basophils Relative: 1 %
Eosinophils Absolute: 0.2 10*3/uL (ref 0.0–0.7)
Eosinophils Relative: 3 %
HEMATOCRIT: 41.4 % (ref 39.0–52.0)
HEMOGLOBIN: 14.5 g/dL (ref 13.0–17.0)
Lymphocytes Relative: 10 %
Lymphs Abs: 0.6 10*3/uL — ABNORMAL LOW (ref 0.7–4.0)
MCH: 34.2 pg — AB (ref 26.0–34.0)
MCHC: 35 g/dL (ref 30.0–36.0)
MCV: 97.6 fL (ref 78.0–100.0)
MONO ABS: 0.9 10*3/uL (ref 0.1–1.0)
MONOS PCT: 15 %
NEUTROS ABS: 4.3 10*3/uL (ref 1.7–7.7)
Neutrophils Relative %: 71 %
Platelets: 92 10*3/uL — ABNORMAL LOW (ref 150–400)
RBC: 4.24 MIL/uL (ref 4.22–5.81)
RDW: 15.7 % — AB (ref 11.5–15.5)
WBC: 6 10*3/uL (ref 4.0–10.5)

## 2016-12-04 LAB — URINALYSIS, DIPSTICK ONLY
GLUCOSE, UA: NEGATIVE mg/dL
HGB URINE DIPSTICK: NEGATIVE
Leukocytes, UA: NEGATIVE
Nitrite: NEGATIVE
PH: 6 (ref 5.0–8.0)
Protein, ur: 100 mg/dL — AB

## 2016-12-04 MED ORDER — LORAZEPAM 2 MG/ML IJ SOLN
1.0000 mg | Freq: Once | INTRAMUSCULAR | Status: AC
  Administered 2016-12-04: 1 mg via INTRAVENOUS
  Filled 2016-12-04: qty 1

## 2016-12-04 MED ORDER — POTASSIUM CHLORIDE CRYS ER 20 MEQ PO TBCR
40.0000 meq | EXTENDED_RELEASE_TABLET | Freq: Once | ORAL | Status: AC
  Administered 2016-12-04: 40 meq via ORAL

## 2016-12-04 MED ORDER — POTASSIUM CHLORIDE CRYS ER 20 MEQ PO TBCR
EXTENDED_RELEASE_TABLET | ORAL | Status: AC
  Filled 2016-12-04: qty 2

## 2016-12-04 MED ORDER — FLUOROURACIL CHEMO INJECTION 2.5 GM/50ML
400.0000 mg/m2 | Freq: Once | INTRAVENOUS | Status: AC
  Administered 2016-12-04: 850 mg via INTRAVENOUS
  Filled 2016-12-04: qty 17

## 2016-12-04 MED ORDER — LEUCOVORIN CALCIUM INJECTION 350 MG
403.0000 mg/m2 | Freq: Once | INTRAVENOUS | Status: AC
  Administered 2016-12-04: 850 mg via INTRAVENOUS
  Filled 2016-12-04: qty 10

## 2016-12-04 MED ORDER — SODIUM CHLORIDE 0.9 % IV SOLN
4.7000 mg/kg | Freq: Once | INTRAVENOUS | Status: AC
  Administered 2016-12-04: 400 mg via INTRAVENOUS
  Filled 2016-12-04: qty 16

## 2016-12-04 MED ORDER — LORAZEPAM 2 MG/ML IJ SOLN
INTRAMUSCULAR | Status: AC
  Filled 2016-12-04: qty 1

## 2016-12-04 MED ORDER — ACETAMINOPHEN 325 MG PO TABS
ORAL_TABLET | ORAL | Status: AC
  Filled 2016-12-04: qty 2

## 2016-12-04 MED ORDER — GI COCKTAIL ~~LOC~~
30.0000 mL | Freq: Once | ORAL | Status: AC
  Administered 2016-12-04: 30 mL via ORAL
  Filled 2016-12-04: qty 30

## 2016-12-04 MED ORDER — OXALIPLATIN CHEMO INJECTION 100 MG/20ML
85.0000 mg/m2 | Freq: Once | INTRAVENOUS | Status: AC
  Administered 2016-12-04: 180 mg via INTRAVENOUS
  Filled 2016-12-04: qty 36

## 2016-12-04 MED ORDER — DEXAMETHASONE SODIUM PHOSPHATE 10 MG/ML IJ SOLN
10.0000 mg | Freq: Once | INTRAMUSCULAR | Status: AC
  Administered 2016-12-04: 10 mg via INTRAVENOUS
  Filled 2016-12-04: qty 1

## 2016-12-04 MED ORDER — PALONOSETRON HCL INJECTION 0.25 MG/5ML
0.2500 mg | Freq: Once | INTRAVENOUS | Status: AC
  Administered 2016-12-04: 0.25 mg via INTRAVENOUS
  Filled 2016-12-04: qty 5

## 2016-12-04 MED ORDER — ACETAMINOPHEN 325 MG PO TABS
650.0000 mg | ORAL_TABLET | Freq: Once | ORAL | Status: AC
  Administered 2016-12-04: 650 mg via ORAL

## 2016-12-04 MED ORDER — SODIUM CHLORIDE 0.9 % IV SOLN
2375.0000 mg/m2 | INTRAVENOUS | Status: DC
  Administered 2016-12-04: 5000 mg via INTRAVENOUS
  Filled 2016-12-04: qty 100

## 2016-12-04 MED ORDER — DEXTROSE 5 % IV SOLN
Freq: Once | INTRAVENOUS | Status: AC
  Administered 2016-12-04: 10:00:00 via INTRAVENOUS

## 2016-12-04 MED ORDER — SODIUM CHLORIDE 0.9% FLUSH
10.0000 mL | INTRAVENOUS | Status: DC | PRN
  Administered 2016-12-04: 10 mL
  Filled 2016-12-04: qty 10

## 2016-12-04 MED ORDER — SODIUM CHLORIDE 0.9 % IV SOLN
Freq: Once | INTRAVENOUS | Status: AC
  Administered 2016-12-04: 14:00:00 via INTRAVENOUS

## 2016-12-04 NOTE — Progress Notes (Signed)
Labs reviewed with MD, potassium and platelets noted. Still waiting on patient to give me a urine specimen for the avastin treatment. Proceed with treatment per MD.   Urinalysis results reviewed with MD, MD is aware of Protein in urine. Proceed with treatment.  Treatment given per orders. Patient tolerated it well without problems. Vitals stable and discharged home from clinic ambulatory. Follow up as scheduled.   Continuous 5FU pump connected.

## 2016-12-04 NOTE — Patient Instructions (Signed)
[facility] Discharge Instructions for Patients Receiving Chemotherapy   Beginning January 23rd 2017 lab work for the Cancer Center will be done in the  Main lab at [facility] on 1st floor. If you have a lab appointment with the Cancer Center please come in thru the  Main Entrance and check in at the main information desk   Today you received the following chemotherapy agents   To help prevent nausea and vomiting after your treatment, we encourage you to take your nausea medication     If you develop nausea and vomiting, or diarrhea that is not controlled by your medication, call the clinic.  The clinic phone number is [phone]. Office hours are Monday-Friday 8:30am-5:00pm.  BELOW ARE SYMPTOMS THAT SHOULD BE REPORTED IMMEDIATELY:  *FEVER GREATER THAN 101.0 F  *CHILLS WITH OR WITHOUT FEVER  NAUSEA AND VOMITING THAT IS NOT CONTROLLED WITH YOUR NAUSEA MEDICATION  *UNUSUAL SHORTNESS OF BREATH  *UNUSUAL BRUISING OR BLEEDING  TENDERNESS IN MOUTH AND THROAT WITH OR WITHOUT PRESENCE OF ULCERS  *URINARY PROBLEMS  *BOWEL PROBLEMS  UNUSUAL RASH Items with * indicate a potential emergency and should be followed up as soon as possible. If you have an emergency after office hours please contact your primary care physician or go to the nearest emergency department.  Please call the clinic during office hours if you have any questions or concerns.   You may also contact the Patient Navigator at [phone] should you have any questions or need assistance in obtaining follow up care.      Resources For Cancer Patients and their Caregivers ? American Cancer Society: Can assist with transportation, wigs, general needs, runs Look Good Feel Better.        1-888-227-6333 ? Cancer Care: Provides financial assistance, online support groups, medication/co-pay assistance.  1-800-813-HOPE (4673) ? [facility] Assists [place] cancer  patients and their families through emotional , educational and financial support.  336-427-4357 ? [place] DSS Where to apply for food stamps, Medicaid and utility assistance. 336-342-1394 ? RCATS: Transportation to medical appointments. 336-347-2287 ? Social Security Administration: May apply for disability if have a Stage IV cancer. 336-342-7796 1-800-772-1213 ? [place] Aging, Disability and Transit Services: Assists with nutrition, care and transit needs. [phone]         

## 2016-12-06 ENCOUNTER — Encounter (HOSPITAL_COMMUNITY): Payer: BLUE CROSS/BLUE SHIELD | Attending: Oncology

## 2016-12-06 ENCOUNTER — Encounter (HOSPITAL_COMMUNITY): Payer: Self-pay

## 2016-12-06 VITALS — BP 110/74 | HR 99 | Temp 97.9°F | Resp 18

## 2016-12-06 DIAGNOSIS — C182 Malignant neoplasm of ascending colon: Secondary | ICD-10-CM

## 2016-12-06 MED ORDER — SODIUM CHLORIDE 0.9% FLUSH
10.0000 mL | INTRAVENOUS | Status: DC | PRN
  Administered 2016-12-06: 10 mL
  Filled 2016-12-06: qty 10

## 2016-12-06 MED ORDER — HEPARIN SOD (PORK) LOCK FLUSH 100 UNIT/ML IV SOLN
INTRAVENOUS | Status: AC
  Filled 2016-12-06: qty 5

## 2016-12-06 MED ORDER — HEPARIN SOD (PORK) LOCK FLUSH 100 UNIT/ML IV SOLN
500.0000 [IU] | Freq: Once | INTRAVENOUS | Status: AC | PRN
  Administered 2016-12-06: 500 [IU]

## 2016-12-06 NOTE — Patient Instructions (Signed)
[place] at [facility] Discharge Instructions  RECOMMENDATIONS MADE BY THE CONSULTANT AND ANY TEST RESULTS WILL BE SENT TO YOUR REFERRING PHYSICIAN.  5FU pump disconnected with port flushed per protocol. Follow-up as scheduled. Call clinic for any questions or concerns  Thank you for choosing [place] at [facility] to provide your oncology and hematology care.  To afford each patient quality time with our provider, please arrive at least 15 minutes before your scheduled appointment time.    If you have a lab appointment with the [facility] please come in thru the  Main Entrance and check in at the main information desk  You need to re-schedule your appointment should you arrive 10 or more minutes late.  We strive to give you quality time with our providers, and arriving late affects you and other patients whose appointments are after yours.  Also, if you no show three or more times for appointments you may be dismissed from the clinic at the providers discretion.     Again, thank you for choosing [facility].  Our hope is that these requests will decrease the amount of time that you wait before being seen by our physicians.       _____________________________________________________________  Should you have questions after your visit to [facility], please contact our office at [phone] between the hours of 8:30 a.m. and 4:30 p.m.  Voicemails left after 4:30 p.m. will not be returned until the following business day.  For prescription refill requests, have your pharmacy contact our office.       Resources For Cancer Patients and their Caregivers ? American Cancer Society: Can assist with transportation, wigs, general needs, runs Look Good Feel Better.        615-652-7995 ? Cancer Care: Provides financial assistance, online support groups, medication/co-pay assistance.  1-800-813-HOPE 478 208 6032) ? [facility] Assists [place] cancer patients and their families through emotional , educational and financial support.  (930)148-6540 ? [place] DSS Where to apply for food stamps, Medicaid and utility assistance. (613)262-0394 ? RCATS: Transportation to medical appointments. 715-745-2561 ? Social Security Administration: May apply for disability if have a Stage IV cancer. (365) 014-5505 865-090-3829 ? [employer], Disability and Transit Services: Assists with nutrition, care and transit needs. [place] Support Programs: @10RELATIVEDAYS @ > Cancer Support Group  2nd Tuesday of the month 1pm-2pm, Journey Room  > Creative Journey  3rd Tuesday of the month 1130am-1pm, Journey Room  > Look Good Feel Better  1st Wednesday of the month 10am-12 noon, Journey Room (Call [facility] to register [phone])

## 2016-12-06 NOTE — Progress Notes (Signed)
Edward Graham tolerated 5FU pump well without complaints or incident. 5FU pump discontinued with port flushed per protocol then de-accessed. VSS Pt discharged self ambulatory in satisfactory condition

## 2016-12-18 ENCOUNTER — Encounter (HOSPITAL_COMMUNITY): Payer: BLUE CROSS/BLUE SHIELD | Admitting: Dietician

## 2016-12-18 ENCOUNTER — Ambulatory Visit (HOSPITAL_COMMUNITY): Payer: BLUE CROSS/BLUE SHIELD

## 2016-12-20 ENCOUNTER — Encounter (HOSPITAL_COMMUNITY): Payer: BLUE CROSS/BLUE SHIELD

## 2016-12-24 ENCOUNTER — Encounter (HOSPITAL_BASED_OUTPATIENT_CLINIC_OR_DEPARTMENT_OTHER): Payer: BLUE CROSS/BLUE SHIELD

## 2016-12-24 ENCOUNTER — Encounter (HOSPITAL_COMMUNITY): Payer: Self-pay | Admitting: Oncology

## 2016-12-24 ENCOUNTER — Encounter (HOSPITAL_BASED_OUTPATIENT_CLINIC_OR_DEPARTMENT_OTHER): Payer: BLUE CROSS/BLUE SHIELD | Admitting: Oncology

## 2016-12-24 ENCOUNTER — Other Ambulatory Visit: Payer: Self-pay

## 2016-12-24 ENCOUNTER — Encounter (HOSPITAL_COMMUNITY): Payer: Self-pay

## 2016-12-24 VITALS — BP 119/78 | HR 101 | Temp 97.6°F | Resp 20

## 2016-12-24 VITALS — BP 91/64 | HR 88 | Resp 16 | Ht 71.5 in | Wt 185.4 lb

## 2016-12-24 DIAGNOSIS — C182 Malignant neoplasm of ascending colon: Secondary | ICD-10-CM

## 2016-12-24 DIAGNOSIS — L240 Irritant contact dermatitis due to detergents: Secondary | ICD-10-CM

## 2016-12-24 DIAGNOSIS — R630 Anorexia: Secondary | ICD-10-CM | POA: Diagnosis not present

## 2016-12-24 DIAGNOSIS — Z5111 Encounter for antineoplastic chemotherapy: Secondary | ICD-10-CM | POA: Diagnosis not present

## 2016-12-24 DIAGNOSIS — F1010 Alcohol abuse, uncomplicated: Secondary | ICD-10-CM | POA: Diagnosis not present

## 2016-12-24 DIAGNOSIS — Z720 Tobacco use: Secondary | ICD-10-CM | POA: Diagnosis not present

## 2016-12-24 DIAGNOSIS — R1900 Intra-abdominal and pelvic swelling, mass and lump, unspecified site: Secondary | ICD-10-CM

## 2016-12-24 DIAGNOSIS — L299 Pruritus, unspecified: Secondary | ICD-10-CM | POA: Insufficient documentation

## 2016-12-24 DIAGNOSIS — Z5112 Encounter for antineoplastic immunotherapy: Secondary | ICD-10-CM

## 2016-12-24 DIAGNOSIS — R918 Other nonspecific abnormal finding of lung field: Secondary | ICD-10-CM

## 2016-12-24 DIAGNOSIS — E876 Hypokalemia: Secondary | ICD-10-CM

## 2016-12-24 DIAGNOSIS — K529 Noninfective gastroenteritis and colitis, unspecified: Secondary | ICD-10-CM | POA: Diagnosis not present

## 2016-12-24 DIAGNOSIS — R197 Diarrhea, unspecified: Secondary | ICD-10-CM

## 2016-12-24 LAB — COMPREHENSIVE METABOLIC PANEL
ALT: 38 U/L (ref 17–63)
AST: 74 U/L — AB (ref 15–41)
Albumin: 2.9 g/dL — ABNORMAL LOW (ref 3.5–5.0)
Alkaline Phosphatase: 207 U/L — ABNORMAL HIGH (ref 38–126)
Anion gap: 11 (ref 5–15)
BILIRUBIN TOTAL: 0.5 mg/dL (ref 0.3–1.2)
CALCIUM: 8 mg/dL — AB (ref 8.9–10.3)
CHLORIDE: 98 mmol/L — AB (ref 101–111)
CO2: 24 mmol/L (ref 22–32)
CREATININE: 0.55 mg/dL — AB (ref 0.61–1.24)
Glucose, Bld: 92 mg/dL (ref 65–99)
Potassium: 3.1 mmol/L — ABNORMAL LOW (ref 3.5–5.1)
Sodium: 133 mmol/L — ABNORMAL LOW (ref 135–145)
TOTAL PROTEIN: 6.3 g/dL — AB (ref 6.5–8.1)

## 2016-12-24 LAB — CBC WITH DIFFERENTIAL/PLATELET
BASOS ABS: 0.1 10*3/uL (ref 0.0–0.1)
BASOS PCT: 1 %
EOS ABS: 0.3 10*3/uL (ref 0.0–0.7)
EOS PCT: 6 %
HEMATOCRIT: 35.9 % — AB (ref 39.0–52.0)
Hemoglobin: 12.1 g/dL — ABNORMAL LOW (ref 13.0–17.0)
Lymphocytes Relative: 20 %
Lymphs Abs: 0.8 10*3/uL (ref 0.7–4.0)
MCH: 34.8 pg — ABNORMAL HIGH (ref 26.0–34.0)
MCHC: 33.7 g/dL (ref 30.0–36.0)
MCV: 103.2 fL — ABNORMAL HIGH (ref 78.0–100.0)
MONO ABS: 0.7 10*3/uL (ref 0.1–1.0)
MONOS PCT: 17 %
NEUTROS ABS: 2.2 10*3/uL (ref 1.7–7.7)
Neutrophils Relative %: 55 %
PLATELETS: 144 10*3/uL — AB (ref 150–400)
RBC: 3.48 MIL/uL — ABNORMAL LOW (ref 4.22–5.81)
RDW: 15.5 % (ref 11.5–15.5)
WBC: 4.1 10*3/uL (ref 4.0–10.5)

## 2016-12-24 LAB — URINALYSIS, DIPSTICK ONLY
Bilirubin Urine: NEGATIVE
GLUCOSE, UA: NEGATIVE mg/dL
HGB URINE DIPSTICK: NEGATIVE
Ketones, ur: NEGATIVE mg/dL
Leukocytes, UA: NEGATIVE
Nitrite: NEGATIVE
PH: 5 (ref 5.0–8.0)
PROTEIN: NEGATIVE mg/dL
Specific Gravity, Urine: 1.014 (ref 1.005–1.030)

## 2016-12-24 MED ORDER — PALONOSETRON HCL INJECTION 0.25 MG/5ML
0.2500 mg | Freq: Once | INTRAVENOUS | Status: AC
  Administered 2016-12-24: 0.25 mg via INTRAVENOUS

## 2016-12-24 MED ORDER — PALONOSETRON HCL INJECTION 0.25 MG/5ML
INTRAVENOUS | Status: AC
  Filled 2016-12-24: qty 5

## 2016-12-24 MED ORDER — SODIUM CHLORIDE 0.9 % IV SOLN
2380.0000 mg/m2 | INTRAVENOUS | Status: DC
  Administered 2016-12-24: 5000 mg via INTRAVENOUS
  Filled 2016-12-24: qty 100

## 2016-12-24 MED ORDER — DEXTROSE 5 % IV SOLN
403.0000 mg/m2 | Freq: Once | INTRAVENOUS | Status: AC
  Administered 2016-12-24: 850 mg via INTRAVENOUS
  Filled 2016-12-24: qty 35

## 2016-12-24 MED ORDER — POTASSIUM CHLORIDE CRYS ER 20 MEQ PO TBCR
EXTENDED_RELEASE_TABLET | ORAL | Status: AC
  Filled 2016-12-24: qty 3

## 2016-12-24 MED ORDER — OXALIPLATIN CHEMO INJECTION 100 MG/20ML
85.0000 mg/m2 | Freq: Once | INTRAVENOUS | Status: AC
  Administered 2016-12-24: 180 mg via INTRAVENOUS
  Filled 2016-12-24: qty 36

## 2016-12-24 MED ORDER — FLUOROURACIL CHEMO INJECTION 2.5 GM/50ML
400.0000 mg/m2 | Freq: Once | INTRAVENOUS | Status: AC
  Administered 2016-12-24: 850 mg via INTRAVENOUS
  Filled 2016-12-24: qty 17

## 2016-12-24 MED ORDER — DEXAMETHASONE SODIUM PHOSPHATE 10 MG/ML IJ SOLN
10.0000 mg | Freq: Once | INTRAMUSCULAR | Status: AC
  Administered 2016-12-24: 10 mg via INTRAVENOUS

## 2016-12-24 MED ORDER — DEXTROSE 5 % IV SOLN
Freq: Once | INTRAVENOUS | Status: AC
  Administered 2016-12-24: 12:00:00 via INTRAVENOUS

## 2016-12-24 MED ORDER — POTASSIUM CHLORIDE CRYS ER 20 MEQ PO TBCR
EXTENDED_RELEASE_TABLET | ORAL | Status: AC
  Filled 2016-12-24: qty 1

## 2016-12-24 MED ORDER — DEXAMETHASONE SODIUM PHOSPHATE 10 MG/ML IJ SOLN
INTRAMUSCULAR | Status: AC
  Filled 2016-12-24: qty 1

## 2016-12-24 MED ORDER — SODIUM CHLORIDE 0.9 % IV SOLN
Freq: Once | INTRAVENOUS | Status: AC
  Administered 2016-12-24: 10:00:00 via INTRAVENOUS

## 2016-12-24 MED ORDER — SODIUM CHLORIDE 0.9% FLUSH
10.0000 mL | INTRAVENOUS | Status: DC | PRN
  Administered 2016-12-24: 10 mL
  Filled 2016-12-24: qty 10

## 2016-12-24 MED ORDER — POTASSIUM CHLORIDE CRYS ER 20 MEQ PO TBCR
60.0000 meq | EXTENDED_RELEASE_TABLET | Freq: Once | ORAL | Status: AC
  Administered 2016-12-24: 60 meq via ORAL

## 2016-12-24 MED ORDER — SODIUM CHLORIDE 0.9 % IV SOLN
4.5000 mg/kg | Freq: Once | INTRAVENOUS | Status: AC
  Administered 2016-12-24: 400 mg via INTRAVENOUS
  Filled 2016-12-24: qty 16

## 2016-12-24 NOTE — Patient Instructions (Signed)
[facility] Discharge Instructions for Patients Receiving Chemotherapy  Today you received the following chemotherapy agents FOLFOX and avastin.    If you develop nausea and vomiting that is not controlled by your nausea medication, call the clinic.   BELOW ARE SYMPTOMS THAT SHOULD BE REPORTED IMMEDIATELY:  *FEVER GREATER THAN 100.5 F  *CHILLS WITH OR WITHOUT FEVER  NAUSEA AND VOMITING THAT IS NOT CONTROLLED WITH YOUR NAUSEA MEDICATION  *UNUSUAL SHORTNESS OF BREATH  *UNUSUAL BRUISING OR BLEEDING  TENDERNESS IN MOUTH AND THROAT WITH OR WITHOUT PRESENCE OF ULCERS  *URINARY PROBLEMS  *BOWEL PROBLEMS  UNUSUAL RASH Items with * indicate a potential emergency and should be followed up as soon as possible.  Feel free to call the clinic should you have any questions or concerns. The clinic phone number is [phone].  Please show the [place] at check-in to the Emergency Department and triage nurse.

## 2016-12-24 NOTE — Progress Notes (Signed)
Patient complains of fatigue and decreased appetite.  Also stated diarrhea prn but manages with medications.  Rates pain in stomach 7 today but does not like using pain medications if he can help it.  Scheduled for oncology follow up visit today.    Labs reviewed with Dr. Talbert Cage and ok to treat today.  Giving patient potassium chloride tab 28meq by mouth for potassium 3.1 today.    Patient tolerated chemotherapy with no complaints voiced.  Port site clean and dry with no bruising or swelling noted at site.  Chemotherapy pump 5FU infusing with no alarms and no complaints voiced from the patient.  VSS with discharge and left ambulatory with no s/s of distress noted.

## 2016-12-24 NOTE — Progress Notes (Signed)
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 Negative MRI appearance of the brain.      06/08/2016 PET scan    IMPRESSION: 1. 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 2. Difficult to exclude additional small foci of peritoneal tumor in the pelvis. Small asymmetric right inguinal and external iliac nodes are mildly hypermetabolic, nonspecific. Correlation with prior imaging results recommended. 3. No evidence of hepatic or thoracic metastatic disease. 4. Subacute rib fractures on the left with associated metabolic activity. 5. Mild esophageal activity, possibly esophagitis. 6. Ventral and left inguinal hernias containing colon. Hepatic steatosis.       06/21/2016 Pathology Results    Soft Tissue Needle Core Biopsy, Right lower quadrant peritoneal mass - ADENOCARCINOMA, CONSISTENT WITH COLONIC PRIMARY.      11/16/2016 Progression    JIR:CVELFYBOFB: 1. Hypermetabolic right lower quadrant mass is increased in size and mildly decreased in metabolism. 2. New hypermetabolic 1.8 cm right peritoneal nodule just inferior to the right liver lobe, compatible with new peritoneal metastasis. 3. No residual enlarged or hypermetabolic abdominopelvic nodes. No hypermetabolic metastatic disease in the chest, liver or skeleton. 4. Stable non focal hypermetabolism in the mid to lower thoracic esophagus without CT correlate, suggesting nonspecific esophagitis . 5. Stable  supraumbilical midline ventral abdominal hernia containing a portion of the transverse colon. Stable small fat containing bilateral inguinal hernias. 6.  Aortic Atherosclerosis (ICD10-I70.0).      11/20/2016 -  Chemotherapy    Avastin added to FOLFOX, starting with cycle 6 of FOLFOX      HISTORY OF PRESENTING ILLNESS:  Edward Graham 56 y.o. male is here because of pT4a, N0 right sided colon cancer with a questionable 8.8 cm mass separate from the primary grouped with the omentum diagnosed 10/2014. He initially presented with right sided abdominal pain and complaint that his right flank was becoming progressively erythematous and edematous. Patient was previously being followed by Dr. Vallarie Mare of [facility] in [place].  He presented to the ED on 10/26/2014 with abdominal pain, a CT showed suspicion for right cecal mass with perforation and decompression into the subcutaneous fat of the right lower abdominal wall and of the right pelvic side wall. He underwent a right hemicolectomy and debulking on 11/30/2014 and low grade mucinous adenocarcinoma was found without definitive invasion of neighboring organs. 33 LN were removed and all were negative for malignancy. Radial margins were unable to be evaluated due to fragmented specimen and perforation. There was a separate 8.8 cm tumor of the omentum was found as well, it was unclear whether this was an implant or a fragmented primary. The omentum itself was negative. He had multiple surgical would infections requiring debridement following his hemicolectomy. He was discharged on 11/12/14. Prior to starting adjuvant therapy he had baseline scans performed which were negative for metastatic disease.  He has received adjuvant CAPEOX, with progression seen on scans in the RLQ mass. He has moved to the [place] area and is transferring care. He was last seen at [facility]  in [place] on 03/02/2016. He had a biopsy scheduled of the RLQ abdominal mass  in march 2018, but did not show up.    He presents with his father, who is taking care of his medical care. He has not been doing well. He states that since his colectomy, he has a lot of diarrhea, about 15-20 bowel movements a day, without relief for Imodium. 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 He also has chronic vertigo/balance issues. Now he has a new onset rash. His sister washed his clothes in a new detergent and now he has a rash on his whole body. He has a history of eczema and the rash has now plaqued over. He tried hydrocortisone cream, but it did not help. The rash is painful. Since January, he has not lost any weight. He uses an inhaler for allergies. The skin on the bottoms of his feet are cracking and painful. He reports leg swelling and abdominal pain. Denies dehydration, headaches, chest pain, SOB, or any other concerns.   INTERVAL HISTORY: Edward Graham presents to the clinic today for follow up. He is accompanied by his father today.  Patient presents today for continued follow-up of his colon cancer and cycle 8 of chemo.  Patient states that his appetite has been poor in the past few weeks and he has no taste buds.  Also continues have chronic diarrhea, which has not increased from his baseline.  Has started seeing an allergist for his diffuse skin rash.  Denies any chest pain, shortness of breath, nausea, vomiting.  He states he has diffuse abdominal tenderness.   MEDICAL HISTORY:  Past Medical History:  Diagnosis Date  . Allergy   . Anxiety   . Colon cancer ([place])   . Depression   . Neuromuscular disorder [facility])     SURGICAL HISTORY: Past Surgical History:  Procedure Laterality Date  . COLON RESECTION    . IR FLUORO GUIDE PORT INSERTION RIGHT  06/01/2016  . IR US GUIDE VASC ACCESS RIGHT  06/01/2016    SOCIAL HISTORY: Social History   Socioeconomic History  . Marital status: Divorced    Spouse name: Not on file  . Number of  children: Not on file  . Years of education: Not on file  . Highest education level: Not on file  Social Needs  . Financial resource strain: Not on file  . Food insecurity - worry: Not on file  . Food insecurity - inability: Not on file  . Transportation needs - medical: Not on file  . Transportation needs - non-medical: Not on file  Occupational History  . Not on file  Tobacco Use  . Smoking status: Current Every Day Smoker    Packs/day: 1.00    Years: 40.00    Pack years: 40.00    Types: Cigarettes  . Smokeless tobacco: Never Used  Substance and Sexual Activity  . Alcohol use: Yes    Comment: 6 pack a week  . Drug use: No  . Sexual activity: Not on file    Comment: divorced  Other Topics Concern  . Not on file  Social History Narrative  . Not on file    FAMILY HISTORY: Family History  Problem Relation Age of Onset  . Emphysema Mother   . Asthma Sister     ALLERGIES:  is allergic to codeine; dust mite extract; mold extract [trichophyton]; and tree extract.  MEDICATIONS:  Current Outpatient Medications  Medication  Sig Dispense Refill  . albuterol (PROVENTIL HFA;VENTOLIN HFA) 108 (90 Base) MCG/ACT inhaler Inhale 2 puffs into the lungs every 6 (six) hours as needed for wheezing or shortness of breath. 1 Inhaler 2  . calcium-vitamin D (OSCAL WITH D) 500-200 MG-UNIT tablet Take 1 tablet by mouth daily with breakfast. 90 tablet 1  . dexamethasone (DECADRON) 4 MG tablet Take 2 tablets (8 mg total) by mouth daily. Start the day after chemotherapy for 2 days. Take with food. 30 tablet 1  . diphenoxylate-atropine (LOMOTIL) 2.5-0.025 MG tablet Take 1 tablet by mouth 4 (four) times daily as needed for diarrhea or loose stools. 30 tablet 1  . fluorouracil CALGB 12458 in sodium chloride 0.9 % 150 mL Inject into the vein. Over 46 hours    . HYDROcodone-acetaminophen (NORCO/VICODIN) 5-325 MG tablet Take 1 tablet by mouth every 6 (six) hours as needed for moderate pain.    .  hydrOXYzine (ATARAX/VISTARIL) 25 MG tablet Take 1 tablet (25 mg total) by mouth every 6 (six) hours as needed for itching. 30 tablet 0  . LEUCOVORIN CALCIUM IV Inject into the vein. Every 2 weeks    . levocetirizine (XYZAL) 5 MG tablet Take 5 mg by mouth every evening.     . lidocaine-prilocaine (EMLA) cream Apply to affected area once 30 g 3  . Multiple Vitamin (THERA) TABS Take 1 tablet by mouth daily.     . mupirocin ointment (BACTROBAN) 2 % APPLY TO AFFECTED AREA AND INSIDE NOSE UP TO 3 TIMES A DAY AS NEEDED  99  . ondansetron (ZOFRAN) 8 MG tablet Take 1 tablet (8 mg total) by mouth 2 (two) times daily as needed for refractory nausea / vomiting. Start on day 3 after chemotherapy. 30 tablet 1  . OXALIPLATIN IV Inject into the vein. Every 2 weeks    . Potassium 99 MG TABS Take 400 mg by mouth daily.    . potassium chloride SA (K-DUR,KLOR-CON) 20 MEQ tablet Take 2 tablets (40 mEq total) by mouth daily. 60 tablet 1  . predniSONE (DELTASONE) 20 MG tablet 3 tabs po daily x 3 days, then 2 tabs x 3 days, then 1.5 tabs x 3 days, then 1 tab x 3 days, then 0.5 tabs x 3 days 27 tablet 0  . prochlorperazine (COMPAZINE) 10 MG tablet Take 1 tablet (10 mg total) by mouth every 6 (six) hours as needed (Nausea or vomiting). 30 tablet 1  . triamcinolone cream (KENALOG) 0.1 % APPLY TO AFFECTED AREA UP TO TWICE A DAY AS NEEDED (NOT TO FACE, GROIN, OR UNDERARMS)  3   No current facility-administered medications for this visit.    Facility-Administered Medications Ordered in Other Visits  Medication Dose Route Frequency Provider Last Rate Last Dose  . fluorouracil (ADRUCIL) 5,000 mg in sodium chloride 0.9 % 150 mL chemo infusion  2,380 mg/m2 (Treatment Plan Recorded) Intravenous 1 day or 1 dose Twana First, MD      . fluorouracil (ADRUCIL) chemo injection 850 mg  400 mg/m2 (Treatment Plan Recorded) Intravenous Once Twana First, MD      . leucovorin 850 mg in dextrose 5 % 250 mL infusion  403 mg/m2 (Treatment Plan  Recorded) Intravenous Once Twana First, MD 146 mL/hr at 12/24/16 1135 850 mg at 12/24/16 1135  . oxaliplatin (ELOXATIN) 180 mg in dextrose 5 % 500 mL chemo infusion  85 mg/m2 (Treatment Plan Recorded) Intravenous Once Twana First, MD 268 mL/hr at 12/24/16 1135 180 mg at 12/24/16 1135  . sodium  chloride flush (NS) 0.9 % injection 10 mL  10 mL Intracatheter PRN Twana First, MD   10 mL at 12/24/16 0840   Review of Systems  Constitutional: Positive for malaise/fatigue. Negative for weight loss.       Poor appetite; dysguesia   HENT: Negative.   Eyes: Negative.   Respiratory: Negative.  Negative for shortness of breath.   Cardiovascular: Negative for chest pain and leg swelling.  Gastrointestinal: Positive for diarrhea. Negative for abdominal pain.  Genitourinary: Negative.   Musculoskeletal: Negative.   Skin: Positive for rash (whole body, chronic).  Neurological: Positive for tingling (hands and feet, stable). Negative for headaches.  Endo/Heme/Allergies: Negative.   Psychiatric/Behavioral: Negative.   All other systems reviewed and are negative. 14 point ROS was done and is otherwise as detailed above or in HPI  PHYSICAL EXAMINATION: ECOG PERFORMANCE STATUS: 1 - Symptomatic but completely ambulatory  Physical Exam  Constitutional: He is oriented to person, place, and time and well-developed, well-nourished, and in no distress.  HENT:  Head: Normocephalic and atraumatic.  Mouth/Throat: Oropharynx is clear and moist.  Eyes: Conjunctivae and EOM are normal. Pupils are equal, round, and reactive to light.  Neck: Normal range of motion. Neck supple.  Cardiovascular: Normal rate, regular rhythm and normal heart sounds. Exam reveals no gallop and no friction rub.  No murmur heard. Pulmonary/Chest: Effort normal and breath sounds normal. No respiratory distress. He has no wheezes. He has no rales. He exhibits no tenderness.  Abdominal: Soft. Bowel sounds are normal. He exhibits no  distension. There is tenderness. There is no rebound and no guarding.  2 large abdominal hernias, nontender, large surgical scars well healed  Musculoskeletal: Normal range of motion. He exhibits no edema.  Neurological: He is alert and oriented to person, place, and time. Gait normal.  Skin: Skin is warm and dry. No rash noted. No erythema.  Diffuse erythematous rash on arms and back tremendously. Some eczema.   Psychiatric: Affect and judgment normal.  Depressed mood.  Nursing note and vitals reviewed.  LABORATORY DATA:  I have reviewed the data as listed Lab Results  Component Value Date   WBC 4.1 12/24/2016   HGB 12.1 (L) 12/24/2016   HCT 35.9 (L) 12/24/2016   MCV 103.2 (H) 12/24/2016   PLT 144 (L) 12/24/2016   CMP     Component Value Date/Time   NA 133 (L) 12/24/2016 0830   K 3.1 (L) 12/24/2016 0830   CL 98 (L) 12/24/2016 0830   CO2 24 12/24/2016 0830   GLUCOSE 92 12/24/2016 0830   BUN <5 (L) 12/24/2016 0830   CREATININE 0.55 (L) 12/24/2016 0830   CALCIUM 8.0 (L) 12/24/2016 0830   PROT 6.3 (L) 12/24/2016 0830   ALBUMIN 2.9 (L) 12/24/2016 0830   AST 74 (H) 12/24/2016 0830   ALT 38 12/24/2016 0830   ALKPHOS 207 (H) 12/24/2016 0830   BILITOT 0.5 12/24/2016 0830   GFRNONAA >60 12/24/2016 0830   GFRAA >60 12/24/2016 0830   PATHOLOGY: 06/21/16 Soft Tissue Needle Core Biopsy, Right lower quadrant peritoneal mass - ADENOCARCINOMA, CONSISTENT WITH COLONIC PRIMARY.  RADIOGRAPHIC STUDIES: I have personally reviewed the radiological images as listed and agreed with the findings in the report.  CT Abdomen 03/04/2016 IMPRESSION: 1. Findings highly concerning for disease progression with interval increase in the soft tissue mass within the right lower quadrant.  2. Apparent thickening of the rectum as can be seen in the setting of proctitis.  3. Hepatic stenosis.   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 Chronic diarrhea following colectomy. Diffuse contact dermatitis due to allergy from detergent Right foot swelling  PLAN: Latest restaging PET unfortunately demonstrated that the hypermetabolic right lower quadrant 8.4 x 6.1 cm heterogeneous partially calcified mass with max SUV 5.4, previously 6.9 x 4.8 cm with max SUV 7.4, increased in size and mildly decreased in Metabolism. New hypermetabolic 1.8 cm right peritoneal nodule just inferior to the right liver lobe with max SUV 11.6. Otherwise no new areas of disease. - Has had Avastin added to his FOLFOX treatments starting with his cycle 6 treatment.  - Dr. Crisoforo Oxford, patient's surgeon, has recommended for patient to receive about 6-8 cycles of chemotherapy with restaging prior to him resecting his retroperitoneal mass. Unsure if this will occur since patient has new disease with the peritoneal nodule.  -Plan to restage in 1 months after he's had 4 treatments with avastin added to the folfox. - Counseled him on continued efforts for smoke cessation and patient verbalized understanding.  -Chronic alcohol abuse. Patient has started drinking alcohol again since the results of his last PET were given to him. - RTC in 4 weeks for follow up and chemo q2weeks with FOLFOX+ Avastin.   All questions were answered. The patient knows to call the clinic with any problems, questions or concerns.   This note was electronically signed by:   Twana First, MD 12/24/2016 12:32 PM

## 2016-12-25 LAB — CEA: CEA: 7.2 ng/mL — ABNORMAL HIGH (ref 0.0–4.7)

## 2016-12-26 ENCOUNTER — Encounter (HOSPITAL_BASED_OUTPATIENT_CLINIC_OR_DEPARTMENT_OTHER): Payer: BLUE CROSS/BLUE SHIELD

## 2016-12-26 ENCOUNTER — Encounter (HOSPITAL_COMMUNITY): Payer: Self-pay

## 2016-12-26 VITALS — BP 98/69 | HR 106 | Temp 97.6°F | Resp 18

## 2016-12-26 DIAGNOSIS — C182 Malignant neoplasm of ascending colon: Secondary | ICD-10-CM | POA: Diagnosis not present

## 2016-12-26 DIAGNOSIS — Z452 Encounter for adjustment and management of vascular access device: Secondary | ICD-10-CM | POA: Diagnosis not present

## 2016-12-26 MED ORDER — SODIUM CHLORIDE 0.9% FLUSH
10.0000 mL | INTRAVENOUS | Status: DC | PRN
  Administered 2016-12-26: 10 mL
  Filled 2016-12-26: qty 10

## 2016-12-26 MED ORDER — HEPARIN SOD (PORK) LOCK FLUSH 100 UNIT/ML IV SOLN
500.0000 [IU] | Freq: Once | INTRAVENOUS | Status: AC | PRN
  Administered 2016-12-26: 500 [IU]

## 2016-12-26 NOTE — Progress Notes (Signed)
Edward Graham tolerated 5FU pump discontinuation with portacath flush well without complaints or incident. Port flushed with 10 ml NS and 5 ml Heparin easily per protocol then de-accessed. VSS Pt discharged self ambulatory in satisfactory condition

## 2016-12-26 NOTE — Patient Instructions (Signed)
[facility] at [facility] Discharge Instructions  RECOMMENDATIONS MADE BY THE CONSULTANT AND ANY TEST RESULTS WILL BE SENT TO YOUR REFERRING PHYSICIAN.  5FU pump discontinued with portacath flushed per protocol today. Follow-up as scheduled. Call clinic for any questions or concerns  Thank you for choosing [facility] at [facility] to provide your oncology and hematology care.  To afford each patient quality time with our provider, please arrive at least 15 minutes before your scheduled appointment time.    If you have a lab appointment with the Cancer Center please come in thru the  Main Entrance and check in at the main information desk  You need to re-schedule your appointment should you arrive 10 or more minutes late.  We strive to give you quality time with our providers, and arriving late affects you and other patients whose appointments are after yours.  Also, if you no show three or more times for appointments you may be dismissed from the clinic at the providers discretion.     Again, thank you for choosing [facility].  Our hope is that these requests will decrease the amount of time that you wait before being seen by our physicians.       _____________________________________________________________  Should you have questions after your visit to [facility], please contact our office at [phone] between the hours of 8:30 a.m. and 4:30 p.m.  Voicemails left after 4:30 p.m. will not be returned until the following business day.  For prescription refill requests, have your pharmacy contact our office.       Resources For Cancer Patients and their Caregivers ? American Cancer Society: Can assist with transportation, wigs, general needs, runs Look Good Feel Better.        1-888-227-6333 ? Cancer Care: Provides financial assistance, online support groups, medication/co-pay assistance.  1-800-813-HOPE  (4673) ? [facility] Assists [place] cancer patients and their families through emotional , educational and financial support.  336-427-4357 ? [place] DSS Where to apply for food stamps, Medicaid and utility assistance. 336-342-1394 ? [facility]: Transportation to medical appointments. 336-347-2287 ? Social Security Administration: May apply for disability if have a Stage IV cancer. 336-342-7796 1-800-772-1213 ? [place] Aging, Disability and Transit Services: Assists with nutrition, care and transit needs. 336-349-2343  Cancer Center Support Programs: @10RELATIVEDAYS@ > Cancer Support Group  2nd Tuesday of the month 1pm-2pm, Journey Room  > Creative Journey  3rd Tuesday of the month 1130am-1pm, Journey Room  > Look Good Feel Better  1st Wednesday of the month 10am-12 noon, Journey Room (Call American Cancer Society to register 1-800-395-5775)   

## 2017-01-01 ENCOUNTER — Encounter (HOSPITAL_COMMUNITY): Payer: Self-pay | Admitting: *Deleted

## 2017-01-01 ENCOUNTER — Emergency Department (HOSPITAL_COMMUNITY): Payer: BLUE CROSS/BLUE SHIELD

## 2017-01-01 ENCOUNTER — Emergency Department (HOSPITAL_COMMUNITY)
Admission: EM | Admit: 2017-01-01 | Discharge: 2017-01-01 | Disposition: A | Discharge status: Home / Self Care | Payer: BLUE CROSS/BLUE SHIELD | Attending: Emergency Medicine | Admitting: Emergency Medicine

## 2017-01-01 DIAGNOSIS — Z79899 Other long term (current) drug therapy: Secondary | ICD-10-CM | POA: Insufficient documentation

## 2017-01-01 DIAGNOSIS — R109 Unspecified abdominal pain: Secondary | ICD-10-CM | POA: Diagnosis present

## 2017-01-01 DIAGNOSIS — C189 Malignant neoplasm of colon, unspecified: Secondary | ICD-10-CM | POA: Insufficient documentation

## 2017-01-01 DIAGNOSIS — F17210 Nicotine dependence, cigarettes, uncomplicated: Secondary | ICD-10-CM | POA: Diagnosis not present

## 2017-01-01 LAB — URINALYSIS, ROUTINE W REFLEX MICROSCOPIC
BILIRUBIN URINE: NEGATIVE
GLUCOSE, UA: NEGATIVE mg/dL
HGB URINE DIPSTICK: NEGATIVE
KETONES UR: NEGATIVE mg/dL
LEUKOCYTES UA: NEGATIVE
Nitrite: NEGATIVE
PH: 5 (ref 5.0–8.0)
PROTEIN: NEGATIVE mg/dL
Specific Gravity, Urine: 1.043 — ABNORMAL HIGH (ref 1.005–1.030)

## 2017-01-01 LAB — LIPASE, BLOOD: LIPASE: 53 U/L — AB (ref 11–51)

## 2017-01-01 LAB — CBC WITH DIFFERENTIAL/PLATELET
BASOS ABS: 0 10*3/uL (ref 0.0–0.1)
BASOS PCT: 0 %
Eosinophils Absolute: 0.2 10*3/uL (ref 0.0–0.7)
Eosinophils Relative: 2 %
HEMATOCRIT: 36.5 % — AB (ref 39.0–52.0)
HEMOGLOBIN: 12.1 g/dL — AB (ref 13.0–17.0)
LYMPHS PCT: 11 %
Lymphs Abs: 0.9 10*3/uL (ref 0.7–4.0)
MCH: 35.4 pg — ABNORMAL HIGH (ref 26.0–34.0)
MCHC: 33.2 g/dL (ref 30.0–36.0)
MCV: 106.7 fL — AB (ref 78.0–100.0)
MONO ABS: 0.9 10*3/uL (ref 0.1–1.0)
Monocytes Relative: 11 %
NEUTROS ABS: 6.4 10*3/uL (ref 1.7–7.7)
NEUTROS PCT: 76 %
Platelets: 135 10*3/uL — ABNORMAL LOW (ref 150–400)
RBC: 3.42 MIL/uL — ABNORMAL LOW (ref 4.22–5.81)
RDW: 16.9 % — AB (ref 11.5–15.5)
WBC: 8.4 10*3/uL (ref 4.0–10.5)

## 2017-01-01 LAB — COMPREHENSIVE METABOLIC PANEL
ALBUMIN: 2.9 g/dL — AB (ref 3.5–5.0)
ALK PHOS: 160 U/L — AB (ref 38–126)
ALT: 33 U/L (ref 17–63)
AST: 33 U/L (ref 15–41)
Anion gap: 8 (ref 5–15)
BILIRUBIN TOTAL: 0.6 mg/dL (ref 0.3–1.2)
BUN: 8 mg/dL (ref 6–20)
CALCIUM: 8.4 mg/dL — AB (ref 8.9–10.3)
CO2: 25 mmol/L (ref 22–32)
CREATININE: 0.67 mg/dL (ref 0.61–1.24)
Chloride: 101 mmol/L (ref 101–111)
GFR calc Af Amer: >60 mL/min (ref >60)
GLUCOSE: 86 mg/dL (ref 65–99)
Potassium: 3.5 mmol/L (ref 3.5–5.1)
Sodium: 134 mmol/L — ABNORMAL LOW (ref 135–145)
TOTAL PROTEIN: 6.3 g/dL — AB (ref 6.5–8.1)

## 2017-01-01 MED ORDER — HYDROMORPHONE HCL 1 MG/ML IJ SOLN
1.0000 mg | Freq: Once | INTRAMUSCULAR | Status: AC
  Administered 2017-01-01: 1 mg via INTRAVENOUS
  Filled 2017-01-01: qty 1

## 2017-01-01 MED ORDER — IOPAMIDOL (ISOVUE-300) INJECTION 61%
100.0000 mL | Freq: Once | INTRAVENOUS | Status: AC | PRN
  Administered 2017-01-01: 100 mL via INTRAVENOUS

## 2017-01-01 MED ORDER — ONDANSETRON HCL 8 MG PO TABS: 8.0000 mg | ORAL_TABLET | ORAL | 0 refills | Status: AC | PRN

## 2017-01-01 MED ORDER — OXYCODONE-ACETAMINOPHEN 5-325 MG PO TABS
1.0000 | ORAL_TABLET | ORAL | 0 refills | Status: DC | PRN
Start: 2017-01-01 — End: 2017-01-07

## 2017-01-01 MED ORDER — CIPROFLOXACIN HCL 500 MG PO TABS: 500.0000 mg | ORAL_TABLET | Freq: Two times a day (BID) | ORAL | 0 refills | Status: DC

## 2017-01-01 MED ORDER — ONDANSETRON HCL 4 MG/2ML IJ SOLN
4.0000 mg | Freq: Once | INTRAMUSCULAR | Status: AC
Start: 2017-01-01 — End: 2017-01-01
  Administered 2017-01-01: 4 mg via INTRAVENOUS
  Filled 2017-01-01: qty 2

## 2017-01-01 MED ORDER — SODIUM CHLORIDE 0.9 % IV BOLUS (SEPSIS)
1000.0000 mL | Freq: Once | INTRAVENOUS | Status: AC
  Administered 2017-01-01: 1000 mL via INTRAVENOUS

## 2017-01-01 MED ORDER — ONDANSETRON HCL 4 MG/2ML IJ SOLN
4.0000 mg | Freq: Once | INTRAMUSCULAR | Status: AC
  Administered 2017-01-01: 4 mg via INTRAVENOUS
  Filled 2017-01-01: qty 2

## 2017-01-01 MED ORDER — METRONIDAZOLE 500 MG PO TABS: 500.0000 mg | ORAL_TABLET | Freq: Two times a day (BID) | ORAL | 0 refills | Status: DC

## 2017-01-01 NOTE — Discharge Instructions (Signed)
You have an abnormal CT scan of your abdomen.  It is my recommendation that you be admitted to the hospital.  Return for worsening pain, fever, chills, dehydration.  Prescription for 2 antibiotics, pain medicine, nausea medicine.  Follow-up with a gastroenterologist.  Phone number given.

## 2017-01-01 NOTE — ED Notes (Signed)
Pt requesting pain meds for tonight since pharmacies are closed

## 2017-01-01 NOTE — ED Provider Notes (Signed)
[facility] EMERGENCY DEPARTMENT Provider Note   CSN: [id number] Arrival date & time: 01/01/17  1334     History   Chief Complaint Chief Complaint  Patient presents with  . Abdominal Pain    cancer pt. and on chemo last week    HPI Edward Graham is a 56 y.o. male.  Crushing abdominal pain radiating to the back for 1 week, getting worse.  He was diagnosed with colon cancer in 2016 and had a colectomy in [place], [place].  He is getting chemotherapy here at [facility].  He reports a decreased appetite, but no vomiting.  He always has diarrhea.  No fever, sweats, chills, chest pain, dyspnea, dysuria.  Severity of pain is moderate to severe.  Palpation makes pain worse.      Past Medical History:  Diagnosis Date  . Allergy   . Anxiety   . Colon cancer ([place])   . Depression   . Neuromuscular disorder [facility])     Patient Active Problem List   Diagnosis Date Noted  . Inguinal hernia of left side without obstruction or gangrene 07/26/2016  . 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 Right leg swelling 05/30/2016  . Abscess or cellulitis, neck 07/07/2015  . Allergic reaction 04/28/2015  . 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 Eczema 04/28/2015  . Hypokalemia 04/07/2015  . Cutaneous abscess of neck 04/07/2015  . Flea bite of multiple sites 04/07/2015  . Rash 04/07/2015  . Diarrhea 02/27/2015  . CINV (chemotherapy-induced nausea and vomiting) 02/27/2015  . Cold sensitivity 02/27/2015  . Rash due to allergy 02/27/2015  . Malignant neoplasm of ascending colon ([place]) 12/09/2014  . Alcohol withdrawal delirium ([place]) 11/12/2014  . Severe protein-calorie malnutrition ([place]) 11/05/2014  . Metabolic encephalopathy 99/24/2683  . Abdominal wall abscess 10/27/2014  . Colonic mass 10/27/2014  . Sepsis ([place]) 10/27/2014    Past Surgical History:  Procedure Laterality Date  . COLON RESECTION    . IR FLUORO GUIDE PORT INSERTION RIGHT  06/01/2016  . IR US GUIDE VASC  ACCESS RIGHT  06/01/2016       Home Medications    Prior to Admission medications   Medication Sig Start Date End Date Taking? Authorizing Provider  albuterol (PROVENTIL HFA;VENTOLIN HFA) 108 (90 Base) MCG/ACT inhaler Inhale 2 puffs into the lungs every 6 (six) hours as needed for wheezing or shortness of breath. 09/25/16  Yes Twana First, MD  calcium-vitamin D (OSCAL WITH D) 500-200 MG-UNIT tablet Take 1 tablet by mouth daily with breakfast. 11/06/16  Yes Twana First, MD  dexamethasone (DECADRON) 4 MG tablet Take 2 tablets (8 mg total) by mouth daily. Start the day after chemotherapy for 2 days. Take with food. 09/05/16  Yes Twana First, MD  diphenoxylate-atropine (LOMOTIL) 2.5-0.025 MG tablet Take 1 tablet by mouth 4 (four) times daily as needed for diarrhea or loose stools. 10/24/16  Yes Twana First, MD  hydrOXYzine (ATARAX/VISTARIL) 25 MG tablet Take 1 tablet (25 mg total) by mouth every 6 (six) hours as needed for itching. 08/02/16  Yes Julianne Rice, MD  LEUCOVORIN CALCIUM IV Inject into the vein. Every 2 weeks   Yes [provider]  levocetirizine (XYZAL) 5 MG tablet Take 5 mg by mouth every evening.    Yes [provider]  lidocaine-prilocaine (EMLA) cream Apply to affected area once 09/05/16  Yes Twana First, MD  Multiple Vitamin (THERA) TABS Take 1 tablet by mouth daily.    Yes [provider]  mupirocin ointment (BACTROBAN) 2 %  APPLY TO AFFECTED AREA AND INSIDE NOSE UP TO 3 TIMES A DAY AS NEEDED 07/26/16  Yes [provider]  ondansetron (ZOFRAN) 8 MG tablet Take 1 tablet (8 mg total) by mouth 2 (two) times daily as needed for refractory nausea / vomiting. Start on day 3 after chemotherapy. 09/05/16  Yes Twana First, MD  OXALIPLATIN IV Inject into the vein. Every 2 weeks   Yes [provider]  Potassium 99 MG TABS Take 400 mg by mouth daily.   Yes [provider]  prochlorperazine (COMPAZINE) 10 MG tablet Take 1 tablet (10 mg total)  by mouth every 6 (six) hours as needed (Nausea or vomiting). 09/05/16  Yes Twana First, MD  triamcinolone cream (KENALOG) 0.1 % APPLY TO AFFECTED AREA UP TO TWICE A DAY AS NEEDED (NOT TO FACE, GROIN, OR UNDERARMS) 07/26/16  Yes [provider]  ciprofloxacin (CIPRO) 500 MG tablet Take 1 tablet (500 mg total) by mouth 2 (two) times daily. 01/01/17   Nat Christen, MD  fluorouracil CALGB 16109 in sodium chloride 0.9 % 150 mL Inject into the vein. Over 46 hours    [provider]  metroNIDAZOLE (FLAGYL) 500 MG tablet Take 1 tablet (500 mg total) by mouth 2 (two) times daily. 01/01/17   Nat Christen, MD  ondansetron (ZOFRAN) 8 MG tablet Take 1 tablet (8 mg total) by mouth every 4 (four) hours as needed. 01/01/17   Nat Christen, MD  oxyCODONE-acetaminophen (PERCOCET) 5-325 MG tablet Take 1-2 tablets by mouth every 4 (four) hours as needed. 01/01/17   Nat Christen, MD  potassium chloride SA (K-DUR,KLOR-CON) 20 MEQ tablet Take 2 tablets (40 mEq total) by mouth daily. Patient not taking: Reported on 01/01/2017 11/06/16   Twana First, MD  predniSONE (DELTASONE) 20 MG tablet 3 tabs po daily x 3 days, then 2 tabs x 3 days, then 1.5 tabs x 3 days, then 1 tab x 3 days, then 0.5 tabs x 3 days Patient not taking: Reported on 01/01/2017 08/02/16   Julianne Rice, MD    Family History Family History  Problem Relation Age of Onset  . Emphysema Mother   . Asthma Sister     Social History Social History   Tobacco Use  . Smoking status: Current Every Day Smoker    Packs/day: 1.00    Years: 40.00    Pack years: 40.00    Types: Cigarettes  . Smokeless tobacco: Never Used  Substance Use Topics  . Alcohol use: Yes    Comment: 6 pack a week  . Drug use: No     Allergies   Dust mite extract; Mold extract [trichophyton]; and Tree extract   Review of Systems Review of Systems  All other systems reviewed and are negative.    Physical Exam Updated Vital Signs BP 119/72   Pulse 79    Resp 18   Ht 5' 11.5" (1.816 m)   Wt 83.5 kg (184 lb)   SpO2 97%   BMI 25.31 kg/m   Physical Exam  Constitutional: He is oriented to person, place, and time. He appears well-developed and well-nourished.  HENT:  Head: Normocephalic and atraumatic.  Eyes: Conjunctivae are normal.  Neck: Neck supple.  Cardiovascular: Normal rate and regular rhythm.  Pulmonary/Chest: Effort normal and breath sounds normal.  Abdominal: Soft. Bowel sounds are normal.  Diffuse generalized abdominal tenderness.  Multiple surgical scars.  Obvious ventral hernia (old)  Musculoskeletal: Normal range of motion.  Neurological: He is alert and oriented to person,  place, and time.  Skin: Skin is warm and dry.  Psychiatric: He has a normal mood and affect. His behavior is normal.  Nursing note and vitals reviewed.    ED Treatments / Results  Labs (all labs ordered are listed, but only abnormal results are displayed) Labs Reviewed  CBC WITH DIFFERENTIAL/PLATELET - Abnormal; Notable for the following components:      Result Value   RBC 3.42 (*)    Hemoglobin 12.1 (*)    HCT 36.5 (*)    MCV 106.7 (*)    MCH 35.4 (*)    RDW 16.9 (*)    Platelets 135 (*)    All other components within normal limits  COMPREHENSIVE METABOLIC PANEL - Abnormal; Notable for the following components:   Sodium 134 (*)    Calcium 8.4 (*)    Total Protein 6.3 (*)    Albumin 2.9 (*)    Alkaline Phosphatase 160 (*)    All other components within normal limits  LIPASE, BLOOD - Abnormal; Notable for the following components:   Lipase 53 (*)    All other components within normal limits  URINALYSIS, ROUTINE W REFLEX MICROSCOPIC - Abnormal; Notable for the following components:   Specific Gravity, Urine 1.043 (*)    All other components within normal limits    EKG  EKG Interpretation None       Radiology Ct Abdomen Pelvis W Contrast  Result Date: 01/01/2017 CLINICAL DATA:  Diffuse abdominal pain with history of colon  cancer EXAM: CT ABDOMEN AND PELVIS WITH CONTRAST TECHNIQUE: Multidetector CT imaging of the abdomen and pelvis was performed using the standard protocol following bolus administration of intravenous contrast. CONTRAST:  184mL ISOVUE-300 IOPAMIDOL (ISOVUE-300) INJECTION 61% COMPARISON:  PET-CT 11/16/2016,  06/08/2016 FINDINGS: Lower chest: Lung bases demonstrate no acute consolidation or pleural effusion. Normal heart size. Hepatobiliary: Diffuse steatosis. No calcified gallstones. No biliary dilatation. Pancreas: No ductal dilatation. Spleen: Normal in size without focal abnormality. Adrenals/Urinary Tract: Adrenal glands are within normal limits. Kidneys are unremarkable. Bladder within normal limits Stomach/Bowel: The stomach is nonenlarged. Suspected wall thickening of the duodenum bulb, second portion of duodenum and proximal third portion of duodenum. Inflammation and fluid in the right upper quadrant of the abdomen. No dilated small bowel. Status post right hemicolectomy with anastomosis in the right lower quadrant. Peripheral orientation of gas within the proximal transverse colon. Vascular/Lymphatic: Aortic atherosclerosis. No aneurysmal dilatation. No significantly enlarged lymph nodes Reproductive: Prostate is unremarkable. Other: Negative for free air. Moderate left fatty inguinal hernia. Right inguinal hernia contains small bowel but no evidence for obstruction. Similar size and appearance of a a right lower quadrant soft tissue mass measuring 8.1 x 5.9 by 9.3 cm with peripheral calcification. Inflammation in the right upper quadrant of the abdomen with some fluid and inflammation extending inferiorly along the right anterior pararenal space. Ventral hernia containing mesentery and transverse colon. 15 mm hyperenhancing soft tissue nodule adjacent to the tip of the liver. Musculoskeletal: No acute or suspicious finding. Old left-sided rib fractures. IMPRESSION: 1. Inflammatory process centered within the  right upper quadrant of the abdomen ; the duodenum appears thickened, findings could be secondary to duodenitis/peptic ulcer disease. No extraluminal gas to suggest perforation. Acute gallbladder disease could also be considered given proximity to inflammatory changes, could correlate with ultrasound. Fluid in the anterior pararenal space, could be seen with pancreatitis and suggest correlation with enzymes. 2. Status post right colectomy. Probable pneumatosis involving the proximal transverse colon. There is no portal  venous gas present and no free air. Pneumatosis can be seen in association with chemotherapy, however recommend correlation with lactic acid as ischemia could also produce pneumatosis 3. Stable partially calcified right lower quadrant mass with additional 1.5 cm hyperenhancing peritoneal nodule inferior to the right lobe of the liver as before. 4. Large ventral hernia containing mesentery and transverse colon but no obstruction. Bowel containing right inguinal hernia, also without obstruction 5. Hepatic steatosis Electronically Signed   By: Donavan Foil M.D.   On: 01/01/2017 19:39    Procedures Procedures (including critical care time)  Medications Ordered in ED Medications  ondansetron (ZOFRAN) injection 4 mg (4 mg Intravenous Given 01/01/17 1713)  sodium chloride 0.9 % bolus 1,000 mL (0 mLs Intravenous Stopped 01/01/17 1827)  HYDROmorphone (DILAUDID) injection 1 mg (1 mg Intravenous Given 01/01/17 1714)  iopamidol (ISOVUE-300) 61 % injection 100 mL (100 mLs Intravenous Contrast Given 01/01/17 1840)  HYDROmorphone (DILAUDID) injection 1 mg (1 mg Intravenous Given 01/01/17 2103)  ondansetron (ZOFRAN) injection 4 mg (4 mg Intravenous Given 01/01/17 2103)     Initial Impression / Assessment and Plan / ED Course  I have reviewed the triage vital signs and the nursing notes.  Pertinent labs & imaging results that were available during my care of the patient were reviewed by me and  considered in my medical decision making (see chart for details).    Patient with a known history of colon cancer, status post right-sided colectomy, currently taking chemotherapy presents with 1 week of abdominal pain.  He has a grossly abnormal CT scan of his abdomen.  All these findings were discussed with the pt and his father.  I strongly recommneded admission to the hospital.  He declined.  Discharge meds Cipro 500 mg, Flagyl 500 mg, Percocet, Zofran 8 mg.  Patient understands to return to the hospital if getting worse.   Final Clinical Impressions(s) / ED Diagnoses   Final diagnoses:  Abdominal pain, unspecified abdominal location    ED Discharge Orders        Ordered    ciprofloxacin (CIPRO) 500 MG tablet  2 times daily     01/01/17 2119    metroNIDAZOLE (FLAGYL) 500 MG tablet  2 times daily     01/01/17 2119    oxyCODONE-acetaminophen (PERCOCET) 5-325 MG tablet  Every 4 hours PRN     01/01/17 2119    ondansetron (ZOFRAN) 8 MG tablet  Every 4 hours PRN     01/01/17 2119       Nat Christen, MD 01/01/17 2141

## 2017-01-01 NOTE — ED Triage Notes (Signed)
Pt with abd pain for a week, pt with colon ca and last chemo last week. Chronic diarrhea, +N/V

## 2017-01-04 ENCOUNTER — Inpatient Hospital Stay (HOSPITAL_COMMUNITY)
Admission: EM | Admit: 2017-01-04 | Discharge: 2017-01-07 | DRG: 384 | Disposition: A | Discharge status: Home / Self Care | Payer: BLUE CROSS/BLUE SHIELD | Attending: Internal Medicine | Admitting: Internal Medicine

## 2017-01-04 ENCOUNTER — Emergency Department (HOSPITAL_COMMUNITY): Payer: BLUE CROSS/BLUE SHIELD

## 2017-01-04 ENCOUNTER — Encounter (HOSPITAL_COMMUNITY): Payer: Self-pay

## 2017-01-04 DIAGNOSIS — R197 Diarrhea, unspecified: Secondary | ICD-10-CM | POA: Diagnosis not present

## 2017-01-04 DIAGNOSIS — F1010 Alcohol abuse, uncomplicated: Secondary | ICD-10-CM | POA: Diagnosis present

## 2017-01-04 DIAGNOSIS — K432 Incisional hernia without obstruction or gangrene: Secondary | ICD-10-CM | POA: Diagnosis present

## 2017-01-04 DIAGNOSIS — Z7952 Long term (current) use of systemic steroids: Secondary | ICD-10-CM

## 2017-01-04 DIAGNOSIS — K2270 Barrett's esophagus without dysplasia: Secondary | ICD-10-CM | POA: Diagnosis present

## 2017-01-04 DIAGNOSIS — G709 Myoneural disorder, unspecified: Secondary | ICD-10-CM | POA: Diagnosis present

## 2017-01-04 DIAGNOSIS — Z792 Long term (current) use of antibiotics: Secondary | ICD-10-CM

## 2017-01-04 DIAGNOSIS — R58 Hemorrhage, not elsewhere classified: Secondary | ICD-10-CM | POA: Diagnosis present

## 2017-01-04 DIAGNOSIS — C189 Malignant neoplasm of colon, unspecified: Secondary | ICD-10-CM

## 2017-01-04 DIAGNOSIS — K529 Noninfective gastroenteritis and colitis, unspecified: Secondary | ICD-10-CM | POA: Diagnosis present

## 2017-01-04 DIAGNOSIS — K766 Portal hypertension: Secondary | ICD-10-CM | POA: Diagnosis present

## 2017-01-04 DIAGNOSIS — F10230 Alcohol dependence with withdrawal, uncomplicated: Secondary | ICD-10-CM | POA: Diagnosis not present

## 2017-01-04 DIAGNOSIS — K269 Duodenal ulcer, unspecified as acute or chronic, without hemorrhage or perforation: Secondary | ICD-10-CM | POA: Diagnosis present

## 2017-01-04 DIAGNOSIS — S40811A Abrasion of right upper arm, initial encounter: Secondary | ICD-10-CM | POA: Diagnosis present

## 2017-01-04 DIAGNOSIS — F10939 Alcohol use, unspecified with withdrawal, unspecified: Secondary | ICD-10-CM | POA: Diagnosis present

## 2017-01-04 DIAGNOSIS — D62 Acute posthemorrhagic anemia: Secondary | ICD-10-CM | POA: Diagnosis present

## 2017-01-04 DIAGNOSIS — K2970 Gastritis, unspecified, without bleeding: Secondary | ICD-10-CM | POA: Diagnosis present

## 2017-01-04 DIAGNOSIS — K210 Gastro-esophageal reflux disease with esophagitis: Secondary | ICD-10-CM | POA: Diagnosis present

## 2017-01-04 DIAGNOSIS — Z9049 Acquired absence of other specified parts of digestive tract: Secondary | ICD-10-CM

## 2017-01-04 DIAGNOSIS — X58XXXA Exposure to other specified factors, initial encounter: Secondary | ICD-10-CM | POA: Diagnosis present

## 2017-01-04 DIAGNOSIS — D696 Thrombocytopenia, unspecified: Secondary | ICD-10-CM | POA: Diagnosis present

## 2017-01-04 DIAGNOSIS — Z85038 Personal history of other malignant neoplasm of large intestine: Secondary | ICD-10-CM

## 2017-01-04 DIAGNOSIS — T39395A Adverse effect of other nonsteroidal anti-inflammatory drugs [NSAID], initial encounter: Secondary | ICD-10-CM | POA: Diagnosis present

## 2017-01-04 DIAGNOSIS — F17210 Nicotine dependence, cigarettes, uncomplicated: Secondary | ICD-10-CM | POA: Diagnosis present

## 2017-01-04 DIAGNOSIS — F10239 Alcohol dependence with withdrawal, unspecified: Secondary | ICD-10-CM | POA: Diagnosis present

## 2017-01-04 DIAGNOSIS — R112 Nausea with vomiting, unspecified: Secondary | ICD-10-CM | POA: Diagnosis not present

## 2017-01-04 DIAGNOSIS — Z79899 Other long term (current) drug therapy: Secondary | ICD-10-CM

## 2017-01-04 DIAGNOSIS — Z91048 Other nonmedicinal substance allergy status: Secondary | ICD-10-CM

## 2017-01-04 DIAGNOSIS — E876 Hypokalemia: Secondary | ICD-10-CM

## 2017-01-04 DIAGNOSIS — R1084 Generalized abdominal pain: Secondary | ICD-10-CM | POA: Diagnosis not present

## 2017-01-04 DIAGNOSIS — K921 Melena: Secondary | ICD-10-CM | POA: Diagnosis present

## 2017-01-04 DIAGNOSIS — K469 Unspecified abdominal hernia without obstruction or gangrene: Secondary | ICD-10-CM | POA: Diagnosis present

## 2017-01-04 DIAGNOSIS — E871 Hypo-osmolality and hyponatremia: Secondary | ICD-10-CM | POA: Diagnosis not present

## 2017-01-04 DIAGNOSIS — F419 Anxiety disorder, unspecified: Secondary | ICD-10-CM | POA: Diagnosis present

## 2017-01-04 DIAGNOSIS — C799 Secondary malignant neoplasm of unspecified site: Secondary | ICD-10-CM

## 2017-01-04 DIAGNOSIS — K3189 Other diseases of stomach and duodenum: Secondary | ICD-10-CM | POA: Diagnosis not present

## 2017-01-04 DIAGNOSIS — S40812A Abrasion of left upper arm, initial encounter: Secondary | ICD-10-CM | POA: Diagnosis present

## 2017-01-04 DIAGNOSIS — K228 Other specified diseases of esophagus: Secondary | ICD-10-CM | POA: Diagnosis not present

## 2017-01-04 DIAGNOSIS — D500 Iron deficiency anemia secondary to blood loss (chronic): Secondary | ICD-10-CM | POA: Diagnosis present

## 2017-01-04 DIAGNOSIS — K2980 Duodenitis without bleeding: Secondary | ICD-10-CM | POA: Diagnosis present

## 2017-01-04 DIAGNOSIS — S80811A Abrasion, right lower leg, initial encounter: Secondary | ICD-10-CM | POA: Diagnosis present

## 2017-01-04 DIAGNOSIS — E869 Volume depletion, unspecified: Secondary | ICD-10-CM | POA: Diagnosis present

## 2017-01-04 DIAGNOSIS — C786 Secondary malignant neoplasm of retroperitoneum and peritoneum: Secondary | ICD-10-CM | POA: Diagnosis present

## 2017-01-04 DIAGNOSIS — F329 Major depressive disorder, single episode, unspecified: Secondary | ICD-10-CM | POA: Diagnosis present

## 2017-01-04 DIAGNOSIS — C182 Malignant neoplasm of ascending colon: Secondary | ICD-10-CM | POA: Diagnosis present

## 2017-01-04 DIAGNOSIS — S80812A Abrasion, left lower leg, initial encounter: Secondary | ICD-10-CM | POA: Diagnosis present

## 2017-01-04 LAB — CBC WITH DIFFERENTIAL/PLATELET
BASOS PCT: 0 %
Basophils Absolute: 0 10*3/uL (ref 0.0–0.1)
EOS ABS: 0.1 10*3/uL (ref 0.0–0.7)
Eosinophils Relative: 2 %
HEMATOCRIT: 34.4 % — AB (ref 39.0–52.0)
HEMOGLOBIN: 11.3 g/dL — AB (ref 13.0–17.0)
LYMPHS ABS: 0.4 10*3/uL — AB (ref 0.7–4.0)
Lymphocytes Relative: 6 %
MCH: 35.2 pg — AB (ref 26.0–34.0)
MCHC: 32.8 g/dL (ref 30.0–36.0)
MCV: 107.2 fL — ABNORMAL HIGH (ref 78.0–100.0)
MONO ABS: 0.7 10*3/uL (ref 0.1–1.0)
MONOS PCT: 10 %
NEUTROS ABS: 5.3 10*3/uL (ref 1.7–7.7)
NEUTROS PCT: 82 %
Platelets: 93 10*3/uL — ABNORMAL LOW (ref 150–400)
RBC: 3.21 MIL/uL — ABNORMAL LOW (ref 4.22–5.81)
RDW: 17.2 % — AB (ref 11.5–15.5)
WBC: 6.4 10*3/uL (ref 4.0–10.5)

## 2017-01-04 LAB — COMPREHENSIVE METABOLIC PANEL
ALBUMIN: 2.4 g/dL — AB (ref 3.5–5.0)
ALK PHOS: 156 U/L — AB (ref 38–126)
ALT: 21 U/L (ref 17–63)
AST: 27 U/L (ref 15–41)
Anion gap: 10 (ref 5–15)
BUN: 6 mg/dL (ref 6–20)
CALCIUM: 7.4 mg/dL — AB (ref 8.9–10.3)
CHLORIDE: 102 mmol/L (ref 101–111)
CO2: 20 mmol/L — AB (ref 22–32)
CREATININE: 0.42 mg/dL — AB (ref 0.61–1.24)
GFR calc Af Amer: >60 mL/min (ref >60)
GFR calc non Af Amer: >60 mL/min (ref >60)
GLUCOSE: 106 mg/dL — AB (ref 65–99)
Potassium: 3 mmol/L — ABNORMAL LOW (ref 3.5–5.1)
SODIUM: 132 mmol/L — AB (ref 135–145)
Total Bilirubin: 0.5 mg/dL (ref 0.3–1.2)
Total Protein: 5.2 g/dL — ABNORMAL LOW (ref 6.5–8.1)

## 2017-01-04 LAB — URINALYSIS, ROUTINE W REFLEX MICROSCOPIC
BILIRUBIN URINE: NEGATIVE
GLUCOSE, UA: NEGATIVE mg/dL
Hgb urine dipstick: NEGATIVE
KETONES UR: NEGATIVE mg/dL
Leukocytes, UA: NEGATIVE
NITRITE: NEGATIVE
PH: 5 (ref 5.0–8.0)
Protein, ur: NEGATIVE mg/dL

## 2017-01-04 LAB — I-STAT CG4 LACTIC ACID, ED
Lactic Acid, Venous: 2.72 mmol/L (ref 0.5–1.9)
Lactic Acid, Venous: 3.44 mmol/L (ref 0.5–1.9)

## 2017-01-04 LAB — MAGNESIUM: Magnesium: 1.6 mg/dL — ABNORMAL LOW (ref 1.7–2.4)

## 2017-01-04 LAB — PHOSPHORUS: Phosphorus: 1.9 mg/dL — ABNORMAL LOW (ref 2.5–4.6)

## 2017-01-04 LAB — LIPASE, BLOOD: LIPASE: 55 U/L — AB (ref 11–51)

## 2017-01-04 MED ORDER — THIAMINE HCL 100 MG/ML IJ SOLN: 100.0000 mg | Freq: Every day | INTRAMUSCULAR | Status: DC

## 2017-01-04 MED ORDER — PANTOPRAZOLE SODIUM 40 MG IV SOLR: 40.0000 mg | Freq: Two times a day (BID) | INTRAVENOUS | Status: DC

## 2017-01-04 MED ORDER — FOLIC ACID 1 MG PO TABS
1.0000 mg | ORAL_TABLET | Freq: Every day | ORAL | Status: DC
  Administered 2017-01-04 – 2017-01-07 (×3): 1 mg via ORAL
  Filled 2017-01-04 (×3): qty 1

## 2017-01-04 MED ORDER — HYDROMORPHONE HCL 1 MG/ML IJ SOLN
1.0000 mg | Freq: Once | INTRAMUSCULAR | Status: AC
  Administered 2017-01-04: 1 mg via INTRAVENOUS
  Filled 2017-01-04: qty 1

## 2017-01-04 MED ORDER — ENOXAPARIN SODIUM 40 MG/0.4ML ~~LOC~~ SOLN
40.0000 mg | SUBCUTANEOUS | Status: DC
  Administered 2017-01-04: 40 mg via SUBCUTANEOUS
  Filled 2017-01-04: qty 0.4

## 2017-01-04 MED ORDER — PANTOPRAZOLE SODIUM 40 MG IV SOLR
INTRAVENOUS | Status: AC
  Filled 2017-01-04: qty 160

## 2017-01-04 MED ORDER — DIPHENOXYLATE-ATROPINE 2.5-0.025 MG PO TABS: 1.0000 | ORAL_TABLET | Freq: Four times a day (QID) | ORAL | Status: DC | PRN

## 2017-01-04 MED ORDER — LORAZEPAM 2 MG/ML IJ SOLN
1.0000 mg | Freq: Four times a day (QID) | INTRAMUSCULAR | Status: DC | PRN
  Administered 2017-01-05 (×2): 1 mg via INTRAVENOUS
  Filled 2017-01-04 (×2): qty 1

## 2017-01-04 MED ORDER — CIPROFLOXACIN IN D5W 400 MG/200ML IV SOLN
400.0000 mg | Freq: Two times a day (BID) | INTRAVENOUS | Status: DC
  Administered 2017-01-04 – 2017-01-06 (×4): 400 mg via INTRAVENOUS
  Filled 2017-01-04 (×4): qty 200

## 2017-01-04 MED ORDER — LORATADINE 10 MG PO TABS
10.0000 mg | ORAL_TABLET | Freq: Every evening | ORAL | Status: DC
  Administered 2017-01-04 – 2017-01-06 (×3): 10 mg via ORAL
  Filled 2017-01-04 (×3): qty 1

## 2017-01-04 MED ORDER — NICOTINE 21 MG/24HR TD PT24
21.0000 mg | MEDICATED_PATCH | Freq: Every day | TRANSDERMAL | Status: DC
  Administered 2017-01-04: 21 mg via TRANSDERMAL
  Filled 2017-01-04 (×2): qty 1

## 2017-01-04 MED ORDER — LEVOCETIRIZINE DIHYDROCHLORIDE 5 MG PO TABS: 5.0000 mg | ORAL_TABLET | Freq: Every evening | ORAL | Status: DC

## 2017-01-04 MED ORDER — ADULT MULTIVITAMIN W/MINERALS CH
1.0000 | ORAL_TABLET | Freq: Every day | ORAL | Status: DC
  Administered 2017-01-04 – 2017-01-07 (×3): 1 via ORAL
  Filled 2017-01-04 (×3): qty 1

## 2017-01-04 MED ORDER — ONDANSETRON HCL 4 MG/2ML IJ SOLN
4.0000 mg | Freq: Once | INTRAMUSCULAR | Status: AC
  Administered 2017-01-04: 4 mg via INTRAVENOUS
  Filled 2017-01-04: qty 2

## 2017-01-04 MED ORDER — METRONIDAZOLE IN NACL 5-0.79 MG/ML-% IV SOLN
500.0000 mg | Freq: Three times a day (TID) | INTRAVENOUS | Status: DC
  Administered 2017-01-04 – 2017-01-06 (×5): 500 mg via INTRAVENOUS
  Filled 2017-01-04 (×5): qty 100

## 2017-01-04 MED ORDER — SODIUM CHLORIDE 0.9 % IV BOLUS (SEPSIS)
1000.0000 mL | Freq: Once | INTRAVENOUS | Status: AC
  Administered 2017-01-04: 1000 mL via INTRAVENOUS

## 2017-01-04 MED ORDER — PANTOPRAZOLE SODIUM 40 MG IV SOLR
40.0000 mg | Freq: Once | INTRAVENOUS | Status: AC
  Administered 2017-01-04: 40 mg via INTRAVENOUS
  Filled 2017-01-04: qty 40

## 2017-01-04 MED ORDER — IOPAMIDOL (ISOVUE-300) INJECTION 61%
100.0000 mL | Freq: Once | INTRAVENOUS | Status: AC | PRN
  Administered 2017-01-04: 100 mL via INTRAVENOUS

## 2017-01-04 MED ORDER — HYDROMORPHONE HCL 1 MG/ML IJ SOLN
0.5000 mg | Freq: Four times a day (QID) | INTRAMUSCULAR | Status: DC
  Administered 2017-01-04 – 2017-01-06 (×8): 0.5 mg via INTRAVENOUS
  Filled 2017-01-04 (×8): qty 1

## 2017-01-04 MED ORDER — POTASSIUM CHLORIDE 10 MEQ/100ML IV SOLN
10.0000 meq | INTRAVENOUS | Status: AC
  Administered 2017-01-04 (×6): 10 meq via INTRAVENOUS
  Filled 2017-01-04 (×3): qty 100

## 2017-01-04 MED ORDER — VITAMIN B-1 100 MG PO TABS
100.0000 mg | ORAL_TABLET | Freq: Every day | ORAL | Status: DC
  Administered 2017-01-04 – 2017-01-07 (×3): 100 mg via ORAL
  Filled 2017-01-04 (×3): qty 1

## 2017-01-04 MED ORDER — ONDANSETRON HCL 4 MG/2ML IJ SOLN
4.0000 mg | Freq: Four times a day (QID) | INTRAMUSCULAR | Status: DC | PRN
  Administered 2017-01-04: 4 mg via INTRAVENOUS
  Filled 2017-01-04: qty 2

## 2017-01-04 MED ORDER — SODIUM CHLORIDE 0.9 % IV SOLN
8.0000 mg/h | INTRAVENOUS | Status: DC
  Administered 2017-01-05: 8 mg/h via INTRAVENOUS
  Filled 2017-01-04 (×7): qty 80

## 2017-01-04 MED ORDER — MUPIROCIN 2 % EX OINT
TOPICAL_OINTMENT | Freq: Three times a day (TID) | CUTANEOUS | Status: DC
  Administered 2017-01-05 – 2017-01-07 (×5): via TOPICAL
  Filled 2017-01-04: qty 22

## 2017-01-04 MED ORDER — POTASSIUM CHLORIDE IN NACL 40-0.9 MEQ/L-% IV SOLN
INTRAVENOUS | Status: DC
  Administered 2017-01-04 – 2017-01-06 (×3): 100 mL/h via INTRAVENOUS

## 2017-01-04 MED ORDER — DIPHENHYDRAMINE HCL 25 MG PO CAPS
25.0000 mg | ORAL_CAPSULE | Freq: Four times a day (QID) | ORAL | Status: DC | PRN
  Administered 2017-01-04: 25 mg via ORAL
  Filled 2017-01-04: qty 1

## 2017-01-04 MED ORDER — LORAZEPAM 1 MG PO TABS
1.0000 mg | ORAL_TABLET | Freq: Four times a day (QID) | ORAL | Status: DC | PRN
  Administered 2017-01-04: 1 mg via ORAL
  Filled 2017-01-04: qty 1

## 2017-01-04 MED ORDER — PANTOPRAZOLE SODIUM 40 MG IV SOLR
40.0000 mg | Freq: Two times a day (BID) | INTRAVENOUS | Status: DC
  Administered 2017-01-04: 40 mg via INTRAVENOUS
  Filled 2017-01-04: qty 40

## 2017-01-04 MED ORDER — HYDROMORPHONE HCL 2 MG/ML IJ SOLN
2.0000 mg | INTRAMUSCULAR | Status: DC | PRN
  Administered 2017-01-04 – 2017-01-06 (×8): 2 mg via INTRAVENOUS
  Filled 2017-01-04 (×8): qty 1

## 2017-01-04 MED ORDER — PANTOPRAZOLE SODIUM 40 MG IV SOLR
80.0000 mg | Freq: Once | INTRAVENOUS | Status: DC
  Filled 2017-01-04: qty 80

## 2017-01-04 NOTE — Consult Note (Signed)
Referring Provider: ER Physician Primary Care Physician:  Twana First, MD Primary Gastroenterologist:  Dr. Oneida Alar (previously unassigned)  Date of Admission: 01/04/2017 Date of Consultation: 01/04/2017  Reason for Consultation:  Abdominal pain  HPI:  Edward Graham is a 56 y.o. male with a past medical history of anxiety, metastatic colon cancer, neuromuscular disorder, depression.  The patient presented to the emergency room with abdominal pain.  Labs and CT were completed.  We were consulted due to abdominal pain with abnormal findings on CT.  Today he states he does not have a regular GI doctor.  He was admitted to the hospital for sepsis at which point they found a "huge tumor" on imaging and subsequently he had multiple surgeries to remove and clean out his abdomen.  He had surgical resection of his colon and is undergoing chemotherapy.  He states he has 1 treatment left.  He denies ever receiving radiation.  He states his surgery was completed in 2016.  ET found mildly progressive inflammatory changes in the right upper quadrant around the duodenum with wall thickening, surrounding inflammation and extraluminal fluid progressive over the previous 3 days but present to some degree 7 weeks prior suggestive of duodenitis of undetermined etiology.  Also noted stable probable metastatic peritoneal implants in the right lower quadrant and inferior to the right hepatic lobe without other evidence of metastatic disease.  Lipase drawn in the ER mildly elevated at 55.  CMP found some electrolyte abnormalities including hyponatremia, hypokalemia.  His creatinine is normal.  AST/ALT normal.  Mildly elevated alkaline phosphatase at 156 which is improved compared to 3 days prior.  CBC found no leukocytosis with a white blood cell count of 6.4, mild anemia with a hemoglobin of 11.3 which is declined about 1 g compared to 3 days prior.  Platelet count also depressed at 93.  His lactic acid is elevated at  2.72.  2 weeks ago he began having abdominal pain.  The pain is gotten progressively worse and is all over his abdomen and radiates around to his back.  He has chronic diarrhea.  In the last 2 weeks his stools have gone from brown to black.  He has taken "an entire bottle" of ibuprofen.  He states he denies aspirin use because he knows that can cause damage to his stomach.  He does have a ventral hernia.  Denies aspirin powders, aspirin, other NSAIDs.  He denies any nausea or vomiting, gross hematochezia.  He has started having heartburn symptoms in the past 2 weeks and has been taking Pepcid for this.  No other overt GI symptoms.  Past Medical History:  Diagnosis Date  . Allergy   . Anxiety   . Colon cancer ([place])   . Depression   . Neuromuscular disorder [facility])     Past Surgical History:  Procedure Laterality Date  . COLON RESECTION    . IR FLUORO GUIDE PORT INSERTION RIGHT  06/01/2016  . IR US GUIDE VASC ACCESS RIGHT  06/01/2016    Prior to Admission medications   Medication Sig Start Date End Date Taking? Authorizing Provider  albuterol (PROVENTIL HFA;VENTOLIN HFA) 108 (90 Base) MCG/ACT inhaler Inhale 2 puffs into the lungs every 6 (six) hours as needed for wheezing or shortness of breath. 09/25/16  Yes Twana First, MD  calcium-vitamin D (OSCAL WITH D) 500-200 MG-UNIT tablet Take 1 tablet by mouth daily with breakfast. 11/06/16  Yes Twana First, MD  ciprofloxacin (CIPRO) 500 MG tablet Take 1 tablet (500 mg total)  by mouth 2 (two) times daily. 01/01/17  Yes Nat Christen, MD  dexamethasone (DECADRON) 4 MG tablet Take 2 tablets (8 mg total) by mouth daily. Start the day after chemotherapy for 2 days. Take with food. 09/05/16  Yes Twana First, MD  diphenoxylate-atropine (LOMOTIL) 2.5-0.025 MG tablet Take 1 tablet by mouth 4 (four) times daily as needed for diarrhea or loose stools. 10/24/16  Yes Twana First, MD  hydrOXYzine (ATARAX/VISTARIL) 25 MG tablet Take 1 tablet (25 mg total) by mouth  every 6 (six) hours as needed for itching. 08/02/16  Yes Julianne Rice, MD  levocetirizine (XYZAL) 5 MG tablet Take 5 mg by mouth every evening.    Yes [provider]  lidocaine-prilocaine (EMLA) cream Apply to affected area once 09/05/16  Yes Twana First, MD  metroNIDAZOLE (FLAGYL) 500 MG tablet Take 1 tablet (500 mg total) by mouth 2 (two) times daily. 01/01/17  Yes Nat Christen, MD  Multiple Vitamin (THERA) TABS Take 1 tablet by mouth daily.    Yes [provider]  mupirocin ointment (BACTROBAN) 2 % APPLY TO AFFECTED AREA AND INSIDE NOSE UP TO 3 TIMES A DAY AS NEEDED 07/26/16  Yes [provider]  ondansetron (ZOFRAN) 8 MG tablet Take 1 tablet (8 mg total) by mouth every 4 (four) hours as needed. 01/01/17  Yes Nat Christen, MD  oxyCODONE-acetaminophen (PERCOCET) 5-325 MG tablet Take 1-2 tablets by mouth every 4 (four) hours as needed. 01/01/17  Yes Nat Christen, MD  Potassium 99 MG TABS Take 400 mg by mouth daily.   Yes [provider]  prochlorperazine (COMPAZINE) 10 MG tablet Take 1 tablet (10 mg total) by mouth every 6 (six) hours as needed (Nausea or vomiting). 09/05/16  Yes Twana First, MD  triamcinolone cream (KENALOG) 0.1 % APPLY TO AFFECTED AREA UP TO TWICE A DAY AS NEEDED (NOT TO FACE, GROIN, OR UNDERARMS) 07/26/16  Yes [provider]  fluorouracil CALGB 24401 in sodium chloride 0.9 % 150 mL Inject into the vein. Over 46 hours    [provider]  LEUCOVORIN CALCIUM IV Inject into the vein. Every 2 weeks    [provider]  OXALIPLATIN IV Inject into the vein. Every 2 weeks    [provider]  potassium chloride SA (K-DUR,KLOR-CON) 20 MEQ tablet Take 2 tablets (40 mEq total) by mouth daily. Patient not taking: Reported on 01/01/2017 11/06/16   Twana First, MD  predniSONE (DELTASONE) 20 MG tablet 3 tabs po daily x 3 days, then 2 tabs x 3 days, then 1.5 tabs x 3 days, then 1 tab x 3 days, then 0.5 tabs x 3 days Patient not  taking: Reported on 01/01/2017 08/02/16   Julianne Rice, MD    No current facility-administered medications for this encounter.    Current Outpatient Medications  Medication Sig Dispense Refill  . albuterol (PROVENTIL HFA;VENTOLIN HFA) 108 (90 Base) MCG/ACT inhaler Inhale 2 puffs into the lungs every 6 (six) hours as needed for wheezing or shortness of breath. 1 Inhaler 2  . calcium-vitamin D (OSCAL WITH D) 500-200 MG-UNIT tablet Take 1 tablet by mouth daily with breakfast. 90 tablet 1  . ciprofloxacin (CIPRO) 500 MG tablet Take 1 tablet (500 mg total) by mouth 2 (two) times daily. 14 tablet 0  . dexamethasone (DECADRON) 4 MG tablet Take 2 tablets (8 mg total) by mouth daily. Start the day after chemotherapy for 2 days. Take with food. 30 tablet 1  . diphenoxylate-atropine (LOMOTIL) 2.5-0.025 MG tablet Take 1  tablet by mouth 4 (four) times daily as needed for diarrhea or loose stools. 30 tablet 1  . hydrOXYzine (ATARAX/VISTARIL) 25 MG tablet Take 1 tablet (25 mg total) by mouth every 6 (six) hours as needed for itching. 30 tablet 0  . levocetirizine (XYZAL) 5 MG tablet Take 5 mg by mouth every evening.     . lidocaine-prilocaine (EMLA) cream Apply to affected area once 30 g 3  . metroNIDAZOLE (FLAGYL) 500 MG tablet Take 1 tablet (500 mg total) by mouth 2 (two) times daily. 14 tablet 0  . Multiple Vitamin (THERA) TABS Take 1 tablet by mouth daily.     . mupirocin ointment (BACTROBAN) 2 % APPLY TO AFFECTED AREA AND INSIDE NOSE UP TO 3 TIMES A DAY AS NEEDED  99  . ondansetron (ZOFRAN) 8 MG tablet Take 1 tablet (8 mg total) by mouth every 4 (four) hours as needed. 10 tablet 0  . oxyCODONE-acetaminophen (PERCOCET) 5-325 MG tablet Take 1-2 tablets by mouth every 4 (four) hours as needed. 20 tablet 0  . Potassium 99 MG TABS Take 400 mg by mouth daily.    . prochlorperazine (COMPAZINE) 10 MG tablet Take 1 tablet (10 mg total) by mouth every 6 (six) hours as needed (Nausea or vomiting). 30 tablet 1  .  triamcinolone cream (KENALOG) 0.1 % APPLY TO AFFECTED AREA UP TO TWICE A DAY AS NEEDED (NOT TO FACE, GROIN, OR UNDERARMS)  3  . fluorouracil CALGB 53614 in sodium chloride 0.9 % 150 mL Inject into the vein. Over 46 hours    . LEUCOVORIN CALCIUM IV Inject into the vein. Every 2 weeks    . OXALIPLATIN IV Inject into the vein. Every 2 weeks    . potassium chloride SA (K-DUR,KLOR-CON) 20 MEQ tablet Take 2 tablets (40 mEq total) by mouth daily. (Patient not taking: Reported on 01/01/2017) 60 tablet 1  . predniSONE (DELTASONE) 20 MG tablet 3 tabs po daily x 3 days, then 2 tabs x 3 days, then 1.5 tabs x 3 days, then 1 tab x 3 days, then 0.5 tabs x 3 days (Patient not taking: Reported on 01/01/2017) 27 tablet 0    Allergies as of 01/04/2017 - Review Complete 01/04/2017  Allergen Reaction Noted  . Dust mite extract  06/01/2016  . Mold extract [trichophyton] Other (See Comments) 06/01/2016  . Tree extract  06/01/2016    Family History  Problem Relation Age of Onset  . Emphysema Mother   . Asthma Sister     Social History   Socioeconomic History  . Marital status: Divorced    Spouse name: Not on file  . Number of children: Not on file  . Years of education: Not on file  . Highest education level: Not on file  Social Needs  . Financial resource strain: Not on file  . Food insecurity - worry: Not on file  . Food insecurity - inability: Not on file  . Transportation needs - medical: Not on file  . Transportation needs - non-medical: Not on file  Occupational History  . Not on file  Tobacco Use  . Smoking status: Current Every Day Smoker    Packs/day: 1.00    Years: 40.00    Pack years: 40.00    Types: Cigarettes  . Smokeless tobacco: Never Used  Substance and Sexual Activity  . Alcohol use: Yes    Comment: 6 pack a week  . Drug use: No  . Sexual activity: Not on file    Comment:  divorced  Other Topics Concern  . Not on file  Social History Narrative  . Not on file    Review  of Systems: General: Negative for anorexia, weight loss, fever, chills, fatigue, weakness. ENT: Negative for hoarseness, nasal congestion. CV: Negative for chest pain, angina, palpitations, peripheral edema.  Respiratory: Negative for dyspnea at rest, cough, sputum, wheezing.  GI: See history of present illness. MS: Negative for joint pain, low back pain.  Derm: Negative for rash or itching.  Endo: Negative for unusual weight change.  Heme: Negative for bruising or bleeding. Allergy: Negative for rash or hives.  Physical Exam: Vital signs in last 24 hours: Temp:  [98 F (36.7 C)] 98 F (36.7 C) (11/30 1222) Pulse Rate:  [69-83] 77 (11/30 1530) Resp:  [12-22] 14 (11/30 1530) BP: (101-121)/(62-75) 102/62 (11/30 1530) SpO2:  [96 %-100 %] 98 % (11/30 1530) Weight:  [184 lb (83.5 kg)] 184 lb (83.5 kg) (11/30 1222)   General:   Alert,  Well-developed, well-nourished, pleasant and cooperative in NAD Head:  Normocephalic and atraumatic. Eyes:  Sclera clear, no icterus. Conjunctiva pink. Ears:  Normal auditory acuity. Neck:  Supple; no masses or thyromegaly. Lungs:  Clear throughout to auscultation.  Mild expiratory wheezes noted bilaterally. No crackles, or rhonchi. No acute distress. Heart:  Regular rate and rhythm; no murmurs, clicks, rubs, or gallops. Abdomen:  Soft, and nondistended. Mild TTP noted. No masses, hepatosplenomegaly noted. Moderate to large ventral hernia noted left abdomen soft and nontender. Normal bowel sounds, without guarding, and without rebound.  Rectal:  Deferred.   Msk:  Symmetrical without gross deformities. Pulses:  Normal bilateral DP pulses noted. Extremities:  Without clubbing or edema. Neurologic:  Alert and  oriented x4;  grossly normal neurologically. Psych:  Alert and cooperative. Normal mood and affect.  Intake/Output from previous day: No intake/output data recorded. Intake/Output this shift: Total I/O In: 1000 [IV Piggyback:1000] Out: -   Lab  Results: Recent Labs    01/01/17 1716 01/04/17 1251  WBC 8.4 6.4  HGB 12.1* 11.3*  HCT 36.5* 34.4*  PLT 135* 93*   BMET Recent Labs    01/01/17 1716 01/04/17 1250  NA 134* 132*  K 3.5 3.0*  CL 101 102  CO2 25 20*  GLUCOSE 86 106*  BUN 8 6  CREATININE 0.67 0.42*  CALCIUM 8.4* 7.4*   LFT Recent Labs    01/01/17 1716 01/04/17 1250  PROT 6.3* 5.2*  ALBUMIN 2.9* 2.4*  AST 33 27  ALT 33 21  ALKPHOS 160* 156*  BILITOT 0.6 0.5   PT/INR No results for input(s): LABPROT, INR in the last 72 hours. Hepatitis Panel No results for input(s): HEPBSAG, HCVAB, HEPAIGM, HEPBIGM in the last 72 hours. C-Diff No results for input(s): CDIFFTOX in the last 72 hours.  Studies/Results: Ct Abdomen Pelvis W Contrast  Result Date: 01/04/2017 CLINICAL DATA:  Abdominal pain, unspecified. History of colon cancer. EXAM: CT ABDOMEN AND PELVIS WITH CONTRAST TECHNIQUE: Multidetector CT imaging of the abdomen and pelvis was performed using the standard protocol following bolus administration of intravenous contrast. CONTRAST:  136mL ISOVUE-300 IOPAMIDOL (ISOVUE-300) INJECTION 61% COMPARISON:  Abdominopelvic CT 01/01/2017.  PET-CT 11/16/2016. FINDINGS: Lower chest: Mild emphysematous changes at both lung bases. The lung bases are otherwise clear. There is no pleural or pericardial effusion. Hepatobiliary: Diffuse hepatic steatosis again noted. No focal hepatic lesion identified. Hyperdense focus along the inferior aspect of the right hepatic lobe (axial image 44) is unchanged, corresponding with hypermetabolism on PET-CT. There  is a small amount of fluid in the subhepatic space. The gallbladder has a stable appearance with mild hyperenhancement, but no significant thickening of its wall. No biliary dilatation. Pancreas: Unremarkable. No pancreatic ductal dilatation or surrounding inflammatory changes. Spleen: Normal in size without focal abnormality. Adrenals/Urinary Tract: Both adrenal glands appear  normal. The kidneys appear normal without evidence of urinary tract calculus, suspicious lesion or hydronephrosis. No bladder abnormalities are seen. Stomach/Bowel: Persistent wall thickening of the distal stomach and proximal small bowel involving all segments of the duodenum. There is surrounding soft tissue stranding and retroperitoneal fluid which have mildly increased over the last 3 days. The duodenum demonstrated some wall thickening on the PET-CT of 7 weeks ago as well although this has progressed. Postsurgical changes from right hemicolectomy and anastomosis are stable. There is probable pneumatosis in the proximal transverse colon, unchanged from recent CT. There is a stable ventral hernia containing the transverse colon. No evidence of incarceration or obstruction. Vascular/Lymphatic: There are no enlarged abdominal or pelvic lymph nodes. Mild aortic and branch vessel atherosclerosis. The portal, superior mesenteric and splenic veins are patent. Reproductive: The prostate gland and seminal vesicles appear unremarkable. Other: Partially calcified right lower quadrant mass again noted, measuring 7.6 x 6.9 cm on image 56. As above, there is a ventral hernia containing transverse colon. In addition, there are bilateral inguinal hernias. There is increased fluid in the right inguinal hernia but no residual herniated bowel. Musculoskeletal: No acute or significant osseous findings. Old rib fractures are present bilaterally. IMPRESSION: 1. Mildly progressive inflammatory changes in the right upper quadrant centered around the duodenum with wall thickening, surrounding inflammation and extraluminal fluid. These changes have progressed over the last 3 days, but were present to some degree on PET-CT of 7 weeks ago. Findings suggest duodenitis of undetermined etiology. Has there been radiation to the right mid abdomen? 2. Probable pneumatosis involving the proximal transverse colon status post right hemicolectomy,  similar to previous study of 3 days ago. No evidence of bowel obstruction or perforation. 3. 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 No other evidence of metastatic disease. 4. Stable ventral hernia containing transverse colon. No evidence of bowel obstruction or incarceration. 5. Additional incidental findings are stable, including hepatic steatosis, bilateral inguinal hernias and Aortic Atherosclerosis (ICD10-I70.0). Electronically Signed   By: Richardean Sale M.D.   On: 01/04/2017 15:19    Impression: Pleasant 56 year old male with metastatic colon cancer and CT imaging demonstrated seeding into the right lower quadrant and inferior to the right hepatic lobe, no other evidence of metastasis.  CT also demonstrated progressive inflammatory changes in the right upper quadrant around the duodenum with wall thickening and surrounding inflammation and extraluminal fluid which is been progressive over the previous 3 days.  Suggestion of duodenitis of undetermined etiology.  The patient denies any radiation therapy.  He has been taking significant amounts of NSAIDs in the past 2 weeks and subsequently has had black stools and new onset GERD symptoms in addition to the structural changes noted on CT.  Duodenal wall thickening likely NSAID duodenitis, also consider gastritis, peptic ulcer disease, metastatic disease.  His last oral intake was yesterday.  Plan: 1. Full liquids today 2. N.p.o. after midnight 3. Discussed the case with Dr. Melony Overly who is on-call this weekend for add on EGD tomorrow and he is now aware. 4. Supportive measures 5. Twice daily IV PPI 6. Monitor for any significant GI bleeding or  melena 7. Monitor H&H 8. Anesthesia is necessary   Thank you for allowing [place] to participate in the care of Kimber Relic, DNP, AGNP-C Adult & Gerontological Nurse Practitioner [facility]    LOS: 0 days     01/04/2017, 4:08 PM

## 2017-01-04 NOTE — H&P (Signed)
History and Physical  Edward Graham UMP:[id number] DOB: Jun 11, 1960 DOA: 01/04/2017  Referring physician: ER Physician, Dr. Regenia Skeeter PCP: Twana First, MD  Outpatient Specialists: Oncology   Patient coming from: Home  Chief Complaint: Nausea and vomiting  HPI: Patient is a 56 year old Caucasian male with history of colon cancer s/p surgery in 2016 in [place], [place], who tells me that he now has stage 4 colon cancer. Collateral information reveals that patient is an alcoholic, and has been on NSAIDs. Patient presents with 2 week history of nausea, vomiting and abdominal pain. No diarrhea, no fever or chills, no sick contacts, no SOB, no chest pain and no urinary symptoms. Patient is currently undergoing chemotherapy according to the patient. Patient has presented to the hospital because of persistent nausea and vomiting (according to the patient). CT of the abdomen and pelvis done on admission revealed duodenal wall thickening and metastatic peritoneal implant involving right lower quadrant and inferior to right hepatic lobe area. Lab work done revealed potassium of 3, sodium of 132, calcium of 7.4 and lactic acid of 3.4. Hemoglobin has dropped from 14.5 to 11.3 over the last one month.   ED Course: IVF. Potassium replacement and imaging  Pertinent labs: See above Imaging: independently reviewed.   Review of Systems:  As in HPI. 12 point review of system was done.  Past Medical History:  Diagnosis Date  . Allergy   . Anxiety   . Colon cancer ([place])   . Depression   . Neuromuscular disorder [facility])     Past Surgical History:  Procedure Laterality Date  . COLON RESECTION    . IR FLUORO GUIDE PORT INSERTION RIGHT  06/01/2016  . IR US GUIDE VASC ACCESS RIGHT  06/01/2016     reports that he has been smoking cigarettes.  He has a 40.00 pack-year smoking history. he has never used smokeless tobacco. He reports that he drinks alcohol. He reports that he does not use drugs.  Allergies  Allergen  Reactions  . Dust Mite Extract   . Mold Extract [Trichophyton] Other (See Comments)  . Tree Extract     All trees, grass, cockroaches, chemicals, perfumes, feathers    Family History  Problem Relation Age of Onset  . Emphysema Mother   . Asthma Sister      Prior to Admission medications   Medication Sig Start Date End Date Taking? Authorizing Provider  albuterol (PROVENTIL HFA;VENTOLIN HFA) 108 (90 Base) MCG/ACT inhaler Inhale 2 puffs into the lungs every 6 (six) hours as needed for wheezing or shortness of breath. 09/25/16  Yes Twana First, MD  calcium-vitamin D (OSCAL WITH D) 500-200 MG-UNIT tablet Take 1 tablet by mouth daily with breakfast. 11/06/16  Yes Twana First, MD  ciprofloxacin (CIPRO) 500 MG tablet Take 1 tablet (500 mg total) by mouth 2 (two) times daily. 01/01/17  Yes Nat Christen, MD  dexamethasone (DECADRON) 4 MG tablet Take 2 tablets (8 mg total) by mouth daily. Start the day after chemotherapy for 2 days. Take with food. 09/05/16  Yes Twana First, MD  diphenoxylate-atropine (LOMOTIL) 2.5-0.025 MG tablet Take 1 tablet by mouth 4 (four) times daily as needed for diarrhea or loose stools. 10/24/16  Yes Twana First, MD  hydrOXYzine (ATARAX/VISTARIL) 25 MG tablet Take 1 tablet (25 mg total) by mouth every 6 (six) hours as needed for itching. 08/02/16  Yes Julianne Rice, MD  levocetirizine (XYZAL) 5 MG tablet Take 5 mg by mouth every evening.    Yes [provider]  lidocaine-prilocaine (EMLA) cream Apply to affected area once 09/05/16  Yes Twana First, MD  metroNIDAZOLE (FLAGYL) 500 MG tablet Take 1 tablet (500 mg total) by mouth 2 (two) times daily. 01/01/17  Yes Nat Christen, MD  Multiple Vitamin (THERA) TABS Take 1 tablet by mouth daily.    Yes [provider]  mupirocin ointment (BACTROBAN) 2 % APPLY TO AFFECTED AREA AND INSIDE NOSE UP TO 3 TIMES A DAY AS NEEDED 07/26/16  Yes [provider]  ondansetron (ZOFRAN) 8 MG tablet Take 1 tablet (8 mg  total) by mouth every 4 (four) hours as needed. 01/01/17  Yes Nat Christen, MD  oxyCODONE-acetaminophen (PERCOCET) 5-325 MG tablet Take 1-2 tablets by mouth every 4 (four) hours as needed. 01/01/17  Yes Nat Christen, MD  Potassium 99 MG TABS Take 400 mg by mouth daily.   Yes [provider]  prochlorperazine (COMPAZINE) 10 MG tablet Take 1 tablet (10 mg total) by mouth every 6 (six) hours as needed (Nausea or vomiting). 09/05/16  Yes Twana First, MD  triamcinolone cream (KENALOG) 0.1 % APPLY TO AFFECTED AREA UP TO TWICE A DAY AS NEEDED (NOT TO FACE, GROIN, OR UNDERARMS) 07/26/16  Yes [provider]  fluorouracil CALGB 67619 in sodium chloride 0.9 % 150 mL Inject into the vein. Over 46 hours    [provider]  LEUCOVORIN CALCIUM IV Inject into the vein. Every 2 weeks    [provider]  OXALIPLATIN IV Inject into the vein. Every 2 weeks    [provider]  potassium chloride SA (K-DUR,KLOR-CON) 20 MEQ tablet Take 2 tablets (40 mEq total) by mouth daily. Patient not taking: Reported on 01/01/2017 11/06/16   Twana First, MD    Physical Exam: Vitals:   01/04/17 1400 01/04/17 1430 01/04/17 1500 01/04/17 1530  BP: 101/70 109/73 121/62 102/62  Pulse: 70 73 75 77  Resp: 12 16 15 14   Temp:      TempSrc:      SpO2: 96% 97% 100% 98%  Weight:      Height:        Constitutional:  . Appears calm and comfortable Eyes:  . No pallor. No jaundice.  ENMT:  . external ears, nose appear normal. Dry buccal mucosa Neck:  . Neck is supple. No JVD Respiratory:  . Expiratory wheeze.  Cardiovascular:  . S1S2 . No LE extremity edema   Abdomen:  . Abdomen is obese, soft and non tender. Several surgical scars, with hernia. Organs are difficult to assess. Neurologic:  . Awake and alert. . Moves all limbs.  Wt Readings from Last 3 Encounters:  01/04/17 83.5 kg (184 lb)  01/01/17 83.5 kg (184 lb)  12/24/16 84.1 kg (185 lb 6.4 oz)    I have personally  reviewed following labs and imaging studies  Labs on Admission:  CBC: Recent Labs  Lab 01/01/17 1716 01/04/17 1251  WBC 8.4 6.4  NEUTROABS 6.4 5.3  HGB 12.1* 11.3*  HCT 36.5* 34.4*  MCV 106.7* 107.2*  PLT 135* 93*   Basic Metabolic Panel: Recent Labs  Lab 01/01/17 1716 01/04/17 1250  NA 134* 132*  K 3.5 3.0*  CL 101 102  CO2 25 20*  GLUCOSE 86 106*  BUN 8 6  CREATININE 0.67 0.42*  CALCIUM 8.4* 7.4*   Liver Function Tests: Recent Labs  Lab 01/01/17 1716 01/04/17 1250  AST 33 27  ALT 33 21  ALKPHOS 160* 156*  BILITOT 0.6 0.5  PROT 6.3* 5.2*  ALBUMIN 2.9* 2.4*   Recent Labs  Lab 01/01/17 1716 01/04/17 1250  LIPASE 53* 55*   No results for input(s): AMMONIA in the last 168 hours. Coagulation Profile: No results for input(s): INR, PROTIME in the last 168 hours. Cardiac Enzymes: No results for input(s): CKTOTAL, CKMB, CKMBINDEX, TROPONINI in the last 168 hours. BNP (last 3 results) No results for input(s): PROBNP in the last 8760 hours. HbA1C: No results for input(s): HGBA1C in the last 72 hours. CBG: No results for input(s): GLUCAP in the last 168 hours. Lipid Profile: No results for input(s): CHOL, HDL, LDLCALC, TRIG, CHOLHDL, LDLDIRECT in the last 72 hours. Thyroid Function Tests: No results for input(s): TSH, T4TOTAL, FREET4, T3FREE, THYROIDAB in the last 72 hours. Anemia Panel: No results for input(s): VITAMINB12, FOLATE, FERRITIN, TIBC, IRON, RETICCTPCT in the last 72 hours. Urine analysis:    Component Value Date/Time   COLORURINE YELLOW 01/04/2017 1224   APPEARANCEUR CLEAR 01/04/2017 1224   LABSPEC >1.046 (H) 01/04/2017 1224   PHURINE 5.0 01/04/2017 1224   GLUCOSEU NEGATIVE 01/04/2017 1224   HGBUR NEGATIVE 01/04/2017 [place] 01/04/2017 1224   KETONESUR NEGATIVE 01/04/2017 1224   PROTEINUR NEGATIVE 01/04/2017 1224   NITRITE NEGATIVE 01/04/2017 1224   LEUKOCYTESUR NEGATIVE 01/04/2017 1224   Sepsis  Labs: @LABRCNTIP (procalcitonin:4,lacticidven:4) )No results found for this or any previous visit (from the past 240 hour(s)).    Radiological Exams on Admission: Ct Abdomen Pelvis W Contrast  Result Date: 01/04/2017 CLINICAL DATA:  Abdominal pain, unspecified. History of colon cancer. EXAM: CT ABDOMEN AND PELVIS WITH CONTRAST TECHNIQUE: Multidetector CT imaging of the abdomen and pelvis was performed using the standard protocol following bolus administration of intravenous contrast. CONTRAST:  184mL ISOVUE-300 IOPAMIDOL (ISOVUE-300) INJECTION 61% COMPARISON:  Abdominopelvic CT 01/01/2017.  PET-CT 11/16/2016. FINDINGS: Lower chest: Mild emphysematous changes at both lung bases. The lung bases are otherwise clear. There is no pleural or pericardial effusion. Hepatobiliary: Diffuse hepatic steatosis again noted. No focal hepatic lesion identified. Hyperdense focus along the inferior aspect of the right hepatic lobe (axial image 44) is unchanged, corresponding with hypermetabolism on PET-CT. There is a small amount of fluid in the subhepatic space. The gallbladder has a stable appearance with mild hyperenhancement, but no significant thickening of its wall. No biliary dilatation. Pancreas: Unremarkable. No pancreatic ductal dilatation or surrounding inflammatory changes. Spleen: Normal in size without focal abnormality. Adrenals/Urinary Tract: Both adrenal glands appear normal. The kidneys appear normal without evidence of urinary tract calculus, suspicious lesion or hydronephrosis. No bladder abnormalities are seen. Stomach/Bowel: Persistent wall thickening of the distal stomach and proximal small bowel involving all segments of the duodenum. There is surrounding soft tissue stranding and retroperitoneal fluid which have mildly increased over the last 3 days. The duodenum demonstrated some wall thickening on the PET-CT of 7 weeks ago as well although this has progressed. Postsurgical changes from right  hemicolectomy and anastomosis are stable. There is probable pneumatosis in the proximal transverse colon, unchanged from recent CT. There is a stable ventral hernia containing the transverse colon. No evidence of incarceration or obstruction. Vascular/Lymphatic: There are no enlarged abdominal or pelvic lymph nodes. Mild aortic and branch vessel atherosclerosis. The portal, superior mesenteric and splenic veins are patent. Reproductive: The prostate gland and seminal vesicles appear unremarkable. Other: Partially calcified right lower quadrant mass again noted, measuring 7.6 x 6.9 cm on image 56. As above, there is a ventral hernia containing transverse colon. In addition, there are bilateral inguinal hernias. There  is increased fluid in the right inguinal hernia but no residual herniated bowel. Musculoskeletal: No acute or significant osseous findings. Old rib fractures are present bilaterally. IMPRESSION: 1. Mildly progressive inflammatory changes in the right upper quadrant centered around the duodenum with wall thickening, surrounding inflammation and extraluminal fluid. These changes have progressed over the last 3 days, but were present to some degree on PET-CT of 7 weeks ago. Findings suggest duodenitis of undetermined etiology. Has there been radiation to the right mid abdomen? 2. Probable pneumatosis involving the proximal transverse colon status post right hemicolectomy, similar to previous study of 3 days ago. No evidence of bowel obstruction or perforation. 3. 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 No other evidence of metastatic disease. 4. Stable ventral hernia containing transverse colon. No evidence of bowel obstruction or incarceration. 5. Additional incidental findings are stable, including hepatic steatosis, bilateral inguinal hernias and Aortic Atherosclerosis (ICD10-I70.0). Electronically Signed   By: Richardean Sale  M.D.   On: 01/04/2017 15:19    Active Problems:   Nausea and vomiting   Assessment/Plan 1. Nausea and vomiting 2. Abdominal pain 3. Stage colon ca 4. Volume depletion 5. NSAIDs use with likely duodenitis 6. Likely upper GI blood loss 7. Anemia, likely related to blood loss verus Multifactorial 8. Hypokalemia 9. Hyponatremia 10. 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 Likely alcohol abuse, continuous     Admit patient  Hydrate patient  Supportive care for nausea and vomiting  Protonix Drip  GI consult  Oncology consult  Monitor and replete electrolytes  IV antiemetics prn  CIWA protocol.  Long term prognosis is guarded  DVT prophylaxis:SCD Code Status: Full Family Communication:  Disposition Plan: Depends on Hospital course   Consults called: Oncology and GI   Admission status: Inpatient    Time spent: 65 minutes  Dana Allan, MD  [facility] Pager #: [phone] 7PM-7AM contact night coverage as above   01/04/2017, 4:49 PM

## 2017-01-04 NOTE — Progress Notes (Signed)
Patient c/o abdominal pain.  Reports pain medication is only lasting two hours.  Night coverage notified via text page.  Night RN notified.

## 2017-01-04 NOTE — ED Provider Notes (Signed)
[facility] EMERGENCY DEPARTMENT Provider Note   CSN: [id number] Arrival date & time: 01/04/17  1212     History   Chief Complaint Chief Complaint  Patient presents with  . Abdominal Pain    HPI Edward Graham is a 56 y.o. male.  HPI  56 year old male with a history of colon cancer status post colectomy and currently on chemotherapy presents with abdominal pain.  His abdominal pain has been ongoing for about 2 weeks.  He was seen here on 11/27 where he had a CT scan that showed significant abnormalities.  He was advised to be admitted but he declined and wanted to try outpatient treatment.  He was given ciprofloxacin and metronidazole as well as Percocet.  Despite this, his abdominal pain is worsening.  The pain is severe.  He denies any fevers but has had vomiting, most recently this morning.  He has chronic diarrhea and this is unchanged from typical for him.  No blood in his stools and no hematemesis.  At that time his CT scan had showed an inflammatory process in the right upper quadrant of the abdomen with his duodenum being sick.  There was no obvious perforation.  The pain radiates to his back.  No urinary symptoms.  Past Medical History:  Diagnosis Date  . Allergy   . Anxiety   . Colon cancer ([place])   . Depression   . Neuromuscular disorder [facility])     Patient Active Problem List   Diagnosis Date Noted  . Inguinal hernia of left side without obstruction or gangrene 07/26/2016  . 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 Right leg swelling 05/30/2016  . Abscess or cellulitis, neck 07/07/2015  . Allergic reaction 04/28/2015  . 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 Eczema 04/28/2015  . Hypokalemia 04/07/2015  . Cutaneous abscess of neck 04/07/2015  . Flea bite of multiple sites 04/07/2015  . Rash 04/07/2015  . Diarrhea 02/27/2015  . CINV (chemotherapy-induced nausea and vomiting) 02/27/2015  . Cold sensitivity 02/27/2015  . Rash due to allergy 02/27/2015  .  Malignant neoplasm of ascending colon ([place]) 12/09/2014  . Alcohol withdrawal delirium ([place]) 11/12/2014  . Severe protein-calorie malnutrition ([place]) 11/05/2014  . Metabolic encephalopathy 16/08/3708  . Abdominal wall abscess 10/27/2014  . Colonic mass 10/27/2014  . Sepsis ([place]) 10/27/2014    Past Surgical History:  Procedure Laterality Date  . COLON RESECTION    . IR FLUORO GUIDE PORT INSERTION RIGHT  06/01/2016  . IR US GUIDE VASC ACCESS RIGHT  06/01/2016       Home Medications    Prior to Admission medications   Medication Sig Start Date End Date Taking? Authorizing Provider  albuterol (PROVENTIL HFA;VENTOLIN HFA) 108 (90 Base) MCG/ACT inhaler Inhale 2 puffs into the lungs every 6 (six) hours as needed for wheezing or shortness of breath. 09/25/16   Twana First, MD  calcium-vitamin D (OSCAL WITH D) 500-200 MG-UNIT tablet Take 1 tablet by mouth daily with breakfast. 11/06/16   Twana First, MD  ciprofloxacin (CIPRO) 500 MG tablet Take 1 tablet (500 mg total) by mouth 2 (two) times daily. 01/01/17   Nat Christen, MD  dexamethasone (DECADRON) 4 MG tablet Take 2 tablets (8 mg total) by mouth daily. Start the day after chemotherapy for 2 days. Take with food. 09/05/16   Twana First, MD  diphenoxylate-atropine (LOMOTIL) 2.5-0.025 MG tablet Take 1 tablet by mouth 4 (four) times daily as needed for diarrhea or loose stools. 10/24/16   Twana First, MD  fluorouracil CALGB 32440 in sodium chloride 0.9 % 150 mL Inject into the vein. Over 46 hours    [provider]  hydrOXYzine (ATARAX/VISTARIL) 25 MG tablet Take 1 tablet (25 mg total) by mouth every 6 (six) hours as needed for itching. 08/02/16   Julianne Rice, MD  LEUCOVORIN CALCIUM IV Inject into the vein. Every 2 weeks    [provider]  levocetirizine (XYZAL) 5 MG tablet Take 5 mg by mouth every evening.     [provider]  lidocaine-prilocaine (EMLA) cream Apply to affected area once 09/05/16   Twana First, MD    metroNIDAZOLE (FLAGYL) 500 MG tablet Take 1 tablet (500 mg total) by mouth 2 (two) times daily. 01/01/17   Nat Christen, MD  Multiple Vitamin (THERA) TABS Take 1 tablet by mouth daily.     [provider]  mupirocin ointment (BACTROBAN) 2 % APPLY TO AFFECTED AREA AND INSIDE NOSE UP TO 3 TIMES A DAY AS NEEDED 07/26/16   [provider]  ondansetron (ZOFRAN) 8 MG tablet Take 1 tablet (8 mg total) by mouth 2 (two) times daily as needed for refractory nausea / vomiting. Start on day 3 after chemotherapy. 09/05/16   Twana First, MD  ondansetron (ZOFRAN) 8 MG tablet Take 1 tablet (8 mg total) by mouth every 4 (four) hours as needed. 01/01/17   Nat Christen, MD  OXALIPLATIN IV Inject into the vein. Every 2 weeks    [provider]  oxyCODONE-acetaminophen (PERCOCET) 5-325 MG tablet Take 1-2 tablets by mouth every 4 (four) hours as needed. 01/01/17   Nat Christen, MD  Potassium 99 MG TABS Take 400 mg by mouth daily.    [provider]  potassium chloride SA (K-DUR,KLOR-CON) 20 MEQ tablet Take 2 tablets (40 mEq total) by mouth daily. Patient not taking: Reported on 01/01/2017 11/06/16   Twana First, MD  predniSONE (DELTASONE) 20 MG tablet 3 tabs po daily x 3 days, then 2 tabs x 3 days, then 1.5 tabs x 3 days, then 1 tab x 3 days, then 0.5 tabs x 3 days Patient not taking: Reported on 01/01/2017 08/02/16   Julianne Rice, MD  prochlorperazine (COMPAZINE) 10 MG tablet Take 1 tablet (10 mg total) by mouth every 6 (six) hours as needed (Nausea or vomiting). 09/05/16   Twana First, MD  triamcinolone cream (KENALOG) 0.1 % APPLY TO AFFECTED AREA UP TO TWICE A DAY AS NEEDED (NOT TO FACE, [place], OR UNDERARMS) 07/26/16   [provider]    Family History Family History  Problem Relation Age of Onset  . Emphysema Mother   . Asthma Sister     Social History Social History   Tobacco Use  . Smoking status: Current Every Day Smoker    Packs/day: 1.00    Years: 40.00     Pack years: 40.00    Types: Cigarettes  . Smokeless tobacco: Never Used  Substance Use Topics  . Alcohol use: Yes    Comment: 6 pack a week  . Drug use: No     Allergies   Dust mite extract; Mold extract [trichophyton]; and Tree extract   Review of Systems Review of Systems  Constitutional: Negative for fever.  Respiratory: Negative for shortness of breath.   Cardiovascular: Negative for chest pain.  Gastrointestinal: Positive for abdominal pain, diarrhea, nausea and vomiting. Negative for blood in stool.  Genitourinary: Negative for dysuria.  Musculoskeletal: Positive for back pain.  All other systems reviewed and are negative.  Physical Exam Updated Vital Signs BP 107/73 (BP Location: Left Arm)   Pulse 83   Temp 98 F (36.7 C) (Oral)   Resp (!) 22   Ht 5' 11.5" (1.816 m)   Wt 83.5 kg (184 lb)   SpO2 100%   BMI 25.31 kg/m   Physical Exam  Constitutional: He is oriented to person, place, and time. He appears well-developed and well-nourished.  Non-toxic appearance.  HENT:  Head: Normocephalic and atraumatic.  Right Ear: External ear normal.  Left Ear: External ear normal.  Nose: Nose normal.  Eyes: Right eye exhibits no discharge. Left eye exhibits no discharge.  Neck: Neck supple.  Cardiovascular: Normal rate, regular rhythm and normal heart sounds.  Pulmonary/Chest: Effort normal and breath sounds normal.  Abdominal: Soft. He exhibits no distension. There is generalized tenderness. There is no CVA tenderness. A hernia is present. Hernia confirmed positive in the ventral area (reducible, soft).  Multiple abdominal scars  Musculoskeletal: He exhibits no edema.       Thoracic back: He exhibits no tenderness.       Lumbar back: He exhibits no tenderness.  Neurological: He is alert and oriented to person, place, and time.  Skin: Skin is warm and dry.  Nursing note and vitals reviewed.    ED Treatments / Results  Labs (all labs ordered are listed, but only  abnormal results are displayed) Labs Reviewed  LIPASE, BLOOD - Abnormal; Notable for the following components:      Result Value   Lipase 55 (*)    All other components within normal limits  COMPREHENSIVE METABOLIC PANEL - Abnormal; Notable for the following components:   Sodium 132 (*)    Potassium 3.0 (*)    CO2 20 (*)    Glucose, Bld 106 (*)    Creatinine, Ser 0.42 (*)    Calcium 7.4 (*)    Total Protein 5.2 (*)    Albumin 2.4 (*)    Alkaline Phosphatase 156 (*)    All other components within normal limits  URINALYSIS, ROUTINE W REFLEX MICROSCOPIC - Abnormal; Notable for the following components:   Specific Gravity, Urine >1.046 (*)    All other components within normal limits  CBC WITH DIFFERENTIAL/PLATELET - Abnormal; Notable for the following components:   RBC 3.21 (*)    Hemoglobin 11.3 (*)    HCT 34.4 (*)    MCV 107.2 (*)    MCH 35.2 (*)    RDW 17.2 (*)    Platelets 93 (*)    Lymphs Abs 0.4 (*)    All other components within normal limits  MAGNESIUM - Abnormal; Notable for the following components:   Magnesium 1.6 (*)    All other components within normal limits  PHOSPHORUS - Abnormal; Notable for the following components:   Phosphorus 1.9 (*)    All other components within normal limits  I-STAT CG4 LACTIC ACID, ED - Abnormal; Notable for the following components:   Lactic Acid, Venous 2.72 (*)    All other components within normal limits  I-STAT CG4 LACTIC ACID, ED - Abnormal; Notable for the following components:   Lactic Acid, Venous 3.44 (*)    All other components within normal limits  CBC WITH DIFFERENTIAL/PLATELET  CBC  HIV ANTIBODY (ROUTINE TESTING)  BASIC METABOLIC PANEL  CBC    EKG  EKG Interpretation None       Radiology Ct Abdomen Pelvis W Contrast  Result Date: 01/04/2017 CLINICAL DATA:  Abdominal pain, unspecified. History of colon  cancer. EXAM: CT ABDOMEN AND PELVIS WITH CONTRAST TECHNIQUE: Multidetector CT imaging of the abdomen and  pelvis was performed using the standard protocol following bolus administration of intravenous contrast. CONTRAST:  13mL ISOVUE-300 IOPAMIDOL (ISOVUE-300) INJECTION 61% COMPARISON:  Abdominopelvic CT 01/01/2017.  PET-CT 11/16/2016. FINDINGS: Lower chest: Mild emphysematous changes at both lung bases. The lung bases are otherwise clear. There is no pleural or pericardial effusion. Hepatobiliary: Diffuse hepatic steatosis again noted. No focal hepatic lesion identified. Hyperdense focus along the inferior aspect of the right hepatic lobe (axial image 44) is unchanged, corresponding with hypermetabolism on PET-CT. There is a small amount of fluid in the subhepatic space. The gallbladder has a stable appearance with mild hyperenhancement, but no significant thickening of its wall. No biliary dilatation. Pancreas: Unremarkable. No pancreatic ductal dilatation or surrounding inflammatory changes. Spleen: Normal in size without focal abnormality. Adrenals/Urinary Tract: Both adrenal glands appear normal. The kidneys appear normal without evidence of urinary tract calculus, suspicious lesion or hydronephrosis. No bladder abnormalities are seen. Stomach/Bowel: Persistent wall thickening of the distal stomach and proximal small bowel involving all segments of the duodenum. There is surrounding soft tissue stranding and retroperitoneal fluid which have mildly increased over the last 3 days. The duodenum demonstrated some wall thickening on the PET-CT of 7 weeks ago as well although this has progressed. Postsurgical changes from right hemicolectomy and anastomosis are stable. There is probable pneumatosis in the proximal transverse colon, unchanged from recent CT. There is a stable ventral hernia containing the transverse colon. No evidence of incarceration or obstruction. Vascular/Lymphatic: There are no enlarged abdominal or pelvic lymph nodes. Mild aortic and branch vessel atherosclerosis. The portal, superior mesenteric  and splenic veins are patent. Reproductive: The prostate gland and seminal vesicles appear unremarkable. Other: Partially calcified right lower quadrant mass again noted, measuring 7.6 x 6.9 cm on image 56. As above, there is a ventral hernia containing transverse colon. In addition, there are bilateral inguinal hernias. There is increased fluid in the right inguinal hernia but no residual herniated bowel. Musculoskeletal: No acute or significant osseous findings. Old rib fractures are present bilaterally. IMPRESSION: 1. Mildly progressive inflammatory changes in the right upper quadrant centered around the duodenum with wall thickening, surrounding inflammation and extraluminal fluid. These changes have progressed over the last 3 days, but were present to some degree on PET-CT of 7 weeks ago. Findings suggest duodenitis of undetermined etiology. Has there been radiation to the right mid abdomen? 2. Probable pneumatosis involving the proximal transverse colon status post right hemicolectomy, similar to previous study of 3 days ago. No evidence of bowel obstruction or perforation. 3. 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 No other evidence of metastatic disease. 4. Stable ventral hernia containing transverse colon. No evidence of bowel obstruction or incarceration. 5. Additional incidental findings are stable, including hepatic steatosis, bilateral inguinal hernias and Aortic Atherosclerosis (ICD10-I70.0). Electronically Signed   By: Richardean Sale M.D.   On: 01/04/2017 15:19    Procedures Procedures (including critical care time)  Medications Ordered in ED Medications  pantoprazole (PROTONIX) injection 40 mg (not administered)  diphenoxylate-atropine (LOMOTIL) 2.5-0.025 MG per tablet 1 tablet (not administered)  mupirocin ointment (BACTROBAN) 2 % (not administered)  ondansetron (ZOFRAN) injection 4 mg (4 mg Intravenous Given  01/04/17 1856)  potassium chloride 10 mEq in 100 mL IVPB (10 mEq Intravenous New Bag/Given 01/04/17 2040)  ciprofloxacin (CIPRO) IVPB 400 mg (not administered)  metroNIDAZOLE (FLAGYL) IVPB  500 mg (500 mg Intravenous New Bag/Given 01/04/17 1846)  0.9 % NaCl with KCl 40 mEq / L  infusion (100 mL/hr Intravenous New Bag/Given 01/04/17 1825)  pantoprazole (PROTONIX) 80 mg in sodium chloride 0.9 % 100 mL IVPB (not administered)  pantoprazole (PROTONIX) 80 mg in sodium chloride 0.9 % 250 mL (0.32 mg/mL) infusion (not administered)  pantoprazole (PROTONIX) injection 40 mg (not administered)  HYDROmorphone (DILAUDID) injection 2 mg (not administered)  LORazepam (ATIVAN) tablet 1 mg (1 mg Oral Given 01/04/17 2033)    Or  LORazepam (ATIVAN) injection 1 mg ( Intravenous See Alternative 01/04/17 2033)  thiamine (VITAMIN B-1) tablet 100 mg (100 mg Oral Given 01/04/17 1920)    Or  thiamine (B-1) injection 100 mg ( Intravenous See Alternative 10/62/69 4854)  folic acid (FOLVITE) tablet 1 mg (1 mg Oral Given 01/04/17 1920)  multivitamin with minerals tablet 1 tablet (1 tablet Oral Given 01/04/17 1920)  HYDROmorphone (DILAUDID) injection 0.5 mg (0.5 mg Intravenous Given 01/04/17 1756)  loratadine (CLARITIN) tablet 10 mg (10 mg Oral Given 01/04/17 2033)  nicotine (NICODERM CQ - dosed in mg/24 hours) patch 21 mg (21 mg Transdermal Patch Applied 01/04/17 2035)  HYDROmorphone (DILAUDID) injection 1 mg (1 mg Intravenous Given 01/04/17 1256)  ondansetron (ZOFRAN) injection 4 mg (4 mg Intravenous Given 01/04/17 1257)  sodium chloride 0.9 % bolus 1,000 mL (0 mLs Intravenous Stopped 01/04/17 1503)  iopamidol (ISOVUE-300) 61 % injection 100 mL (100 mLs Intravenous Contrast Given 01/04/17 1435)  HYDROmorphone (DILAUDID) injection 1 mg (1 mg Intravenous Given 01/04/17 1521)  pantoprazole (PROTONIX) injection 40 mg (40 mg Intravenous Given 01/04/17 1543)  HYDROmorphone (DILAUDID) injection 1 mg (1 mg Intravenous Given  01/04/17 2036)     Initial Impression / Assessment and Plan / ED Course  I have reviewed the triage vital signs and the nursing notes.  Pertinent labs & imaging results that were available during my care of the patient were reviewed by me and considered in my medical decision making (see chart for details).     Repeat CT obtained and shows overall worsening in the right upper quadrant.  This is most consistent with duodenitis.  He states he has never had abdominal radiation.  There appears to be no obvious perforation.  His hemoglobin is down one-point compared to a few days ago.  His pain is controlled with IV Dilaudid and his abdomen is soft with no signs of peritonitis.  He will be given IV Protonix and Dr. Oneida Alar of gastroenterology was consulted.  He will need an EGD.  He has not had any hypotension or signs of shock.  Hospitalist to admit for further treatment and care.  Final Clinical Impressions(s) / ED Diagnoses   Final diagnoses:  Duodenitis    ED Discharge Orders    None       Sherwood Gambler, MD 01/04/17 2131

## 2017-01-04 NOTE — ED Triage Notes (Signed)
Pt is here due to continued abdominal pain. Pt was seen several days ago and refused admission. Pt taking meds as prescribed without relief. Hx of colon cancer

## 2017-01-05 ENCOUNTER — Encounter (HOSPITAL_COMMUNITY)
Admission: EM | Disposition: A | Discharge status: Home / Self Care | Payer: Self-pay | Source: Home / Self Care | Attending: Internal Medicine

## 2017-01-05 ENCOUNTER — Other Ambulatory Visit: Payer: Self-pay

## 2017-01-05 DIAGNOSIS — F1010 Alcohol abuse, uncomplicated: Secondary | ICD-10-CM | POA: Diagnosis present

## 2017-01-05 DIAGNOSIS — D696 Thrombocytopenia, unspecified: Secondary | ICD-10-CM | POA: Diagnosis present

## 2017-01-05 DIAGNOSIS — K228 Other specified diseases of esophagus: Secondary | ICD-10-CM

## 2017-01-05 DIAGNOSIS — F10939 Alcohol use, unspecified with withdrawal, unspecified: Secondary | ICD-10-CM | POA: Diagnosis present

## 2017-01-05 DIAGNOSIS — K269 Duodenal ulcer, unspecified as acute or chronic, without hemorrhage or perforation: Secondary | ICD-10-CM

## 2017-01-05 DIAGNOSIS — D62 Acute posthemorrhagic anemia: Secondary | ICD-10-CM

## 2017-01-05 DIAGNOSIS — K3189 Other diseases of stomach and duodenum: Secondary | ICD-10-CM

## 2017-01-05 DIAGNOSIS — K210 Gastro-esophageal reflux disease with esophagitis: Secondary | ICD-10-CM

## 2017-01-05 DIAGNOSIS — K921 Melena: Secondary | ICD-10-CM

## 2017-01-05 DIAGNOSIS — F10230 Alcohol dependence with withdrawal, uncomplicated: Secondary | ICD-10-CM

## 2017-01-05 DIAGNOSIS — C182 Malignant neoplasm of ascending colon: Secondary | ICD-10-CM

## 2017-01-05 DIAGNOSIS — F10239 Alcohol dependence with withdrawal, unspecified: Secondary | ICD-10-CM | POA: Diagnosis present

## 2017-01-05 DIAGNOSIS — K766 Portal hypertension: Secondary | ICD-10-CM

## 2017-01-05 HISTORY — PX: ESOPHAGOGASTRODUODENOSCOPY: SHX5428

## 2017-01-05 LAB — CBC
HCT: 32 % — ABNORMAL LOW (ref 39.0–52.0)
Hemoglobin: 10.3 g/dL — ABNORMAL LOW (ref 13.0–17.0)
MCH: 35.5 pg — ABNORMAL HIGH (ref 26.0–34.0)
MCHC: 32.2 g/dL (ref 30.0–36.0)
MCV: 110.3 fL — ABNORMAL HIGH (ref 78.0–100.0)
Platelets: 100 10*3/uL — ABNORMAL LOW (ref 150–400)
RBC: 2.9 MIL/uL — ABNORMAL LOW (ref 4.22–5.81)
RDW: 18 % — ABNORMAL HIGH (ref 11.5–15.5)
WBC: 4.6 10*3/uL (ref 4.0–10.5)

## 2017-01-05 LAB — BASIC METABOLIC PANEL
Anion gap: 6 (ref 5–15)
BUN: 7 mg/dL (ref 6–20)
CO2: 24 mmol/L (ref 22–32)
Calcium: 7.7 mg/dL — ABNORMAL LOW (ref 8.9–10.3)
Chloride: 104 mmol/L (ref 101–111)
Creatinine, Ser: 0.52 mg/dL — ABNORMAL LOW (ref 0.61–1.24)
GFR calc Af Amer: >60 mL/min (ref >60)
GFR calc non Af Amer: >60 mL/min (ref >60)
Glucose, Bld: 81 mg/dL (ref 65–99)
Potassium: 4.6 mmol/L (ref 3.5–5.1)
Sodium: 134 mmol/L — ABNORMAL LOW (ref 135–145)

## 2017-01-05 LAB — CBC WITH DIFFERENTIAL/PLATELET
Basophils Absolute: 0 10*3/uL (ref 0.0–0.1)
Basophils Relative: 0 %
EOS ABS: 0.1 10*3/uL (ref 0.0–0.7)
Eosinophils Relative: 1 %
HEMATOCRIT: 34.4 % — AB (ref 39.0–52.0)
HEMOGLOBIN: 10.9 g/dL — AB (ref 13.0–17.0)
LYMPHS ABS: 0.8 10*3/uL (ref 0.7–4.0)
Lymphocytes Relative: 10 %
MCH: 34.8 pg — AB (ref 26.0–34.0)
MCHC: 31.7 g/dL (ref 30.0–36.0)
MCV: 109.9 fL — ABNORMAL HIGH (ref 78.0–100.0)
MONO ABS: 1.1 10*3/uL — AB (ref 0.1–1.0)
MONOS PCT: 14 %
NEUTROS PCT: 75 %
Neutro Abs: 5.8 10*3/uL (ref 1.7–7.7)
Platelets: 115 10*3/uL — ABNORMAL LOW (ref 150–400)
RBC: 3.13 MIL/uL — ABNORMAL LOW (ref 4.22–5.81)
RDW: 18.1 % — ABNORMAL HIGH (ref 11.5–15.5)
WBC: 7.8 10*3/uL (ref 4.0–10.5)

## 2017-01-05 SURGERY — EGD (ESOPHAGOGASTRODUODENOSCOPY)
Anesthesia: Moderate Sedation

## 2017-01-05 MED ORDER — HYOSCYAMINE SULFATE ER 0.375 MG PO TB12
0.3750 mg | ORAL_TABLET | Freq: Two times a day (BID) | ORAL | Status: DC
  Administered 2017-01-05 – 2017-01-07 (×3): 0.375 mg via ORAL
  Filled 2017-01-05 (×8): qty 1

## 2017-01-05 MED ORDER — PROMETHAZINE HCL 25 MG/ML IJ SOLN
INTRAMUSCULAR | Status: DC | PRN
  Administered 2017-01-05: 12.5 mg via INTRAVENOUS

## 2017-01-05 MED ORDER — MIDAZOLAM HCL 5 MG/5ML IJ SOLN
INTRAMUSCULAR | Status: DC | PRN
  Administered 2017-01-05 (×2): 2 mg via INTRAVENOUS
  Administered 2017-01-05: 1 mg via INTRAVENOUS

## 2017-01-05 MED ORDER — DEXAMETHASONE SODIUM PHOSPHATE 4 MG/ML IJ SOLN
INTRAMUSCULAR | Status: DC | PRN
  Administered 2017-01-05: 4 mg via INTRAVENOUS

## 2017-01-05 MED ORDER — NICOTINE 21 MG/24HR TD PT24
21.0000 mg | MEDICATED_PATCH | Freq: Every day | TRANSDERMAL | Status: DC
  Administered 2017-01-05 – 2017-01-07 (×3): 21 mg via TRANSDERMAL
  Filled 2017-01-05 (×3): qty 1

## 2017-01-05 MED ORDER — LIDOCAINE VISCOUS 2 % MT SOLN
OROMUCOSAL | Status: AC
  Filled 2017-01-05: qty 15

## 2017-01-05 MED ORDER — PROMETHAZINE HCL 25 MG/ML IJ SOLN
INTRAMUSCULAR | Status: AC
  Filled 2017-01-05: qty 1

## 2017-01-05 MED ORDER — PROMETHAZINE HCL 25 MG/ML IJ SOLN
12.5000 mg | Freq: Once | INTRAMUSCULAR | Status: AC
  Administered 2017-01-05: 12.5 mg via INTRAVENOUS
  Filled 2017-01-05: qty 1

## 2017-01-05 MED ORDER — DIPHENHYDRAMINE HCL 50 MG/ML IJ SOLN
25.0000 mg | Freq: Four times a day (QID) | INTRAMUSCULAR | Status: DC | PRN
  Administered 2017-01-05: 25 mg via INTRAVENOUS
  Filled 2017-01-05: qty 1

## 2017-01-05 MED ORDER — DEXAMETHASONE SODIUM PHOSPHATE 4 MG/ML IJ SOLN
INTRAMUSCULAR | Status: AC
  Filled 2017-01-05: qty 1

## 2017-01-05 MED ORDER — LIDOCAINE VISCOUS 2 % MT SOLN
OROMUCOSAL | Status: DC | PRN
  Administered 2017-01-05: 1 via OROMUCOSAL

## 2017-01-05 MED ORDER — LIDOCAINE VISCOUS 2 % MT SOLN
15.0000 mL | OROMUCOSAL | Status: DC | PRN
  Administered 2017-01-06: 15 mL via OROMUCOSAL
  Filled 2017-01-05: qty 15

## 2017-01-05 MED ORDER — HYOSCYAMINE SULFATE 0.125 MG/ML PO SOLN
0.2500 mg | Freq: Four times a day (QID) | ORAL | Status: DC | PRN
  Filled 2017-01-05: qty 2

## 2017-01-05 MED ORDER — MEPERIDINE HCL 50 MG/ML IJ SOLN
INTRAMUSCULAR | Status: AC
  Filled 2017-01-05: qty 1

## 2017-01-05 MED ORDER — SUCRALFATE 1 GM/10ML PO SUSP
1.0000 g | Freq: Three times a day (TID) | ORAL | Status: DC
  Administered 2017-01-05 – 2017-01-07 (×9): 1 g via ORAL
  Filled 2017-01-05 (×9): qty 10

## 2017-01-05 MED ORDER — STERILE WATER FOR IRRIGATION IR SOLN
Status: DC | PRN
  Administered 2017-01-05: 10:00:00

## 2017-01-05 MED ORDER — MEPERIDINE HCL 50 MG/ML IJ SOLN
INTRAMUSCULAR | Status: DC | PRN
  Administered 2017-01-05 (×2): 25 mg via INTRAVENOUS

## 2017-01-05 MED ORDER — HYDROXYZINE HCL 25 MG PO TABS
25.0000 mg | ORAL_TABLET | ORAL | Status: DC | PRN
  Administered 2017-01-05 – 2017-01-06 (×2): 50 mg via ORAL
  Filled 2017-01-05 (×2): qty 2

## 2017-01-05 MED ORDER — MIDAZOLAM HCL 5 MG/5ML IJ SOLN
INTRAMUSCULAR | Status: AC
  Filled 2017-01-05: qty 10

## 2017-01-05 NOTE — Progress Notes (Signed)
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 3 cm long Barrett's mucosa with multiple squamous islands. Portal hypertensive gastropathy. Multiple bulbar and post bulbar ulcers without bleeding.  2 ulcers covered with black eschar.  No active bleeding. Largest ulcer located in second part of the duodenum about 2 cm in diameter and very deep.

## 2017-01-05 NOTE — Progress Notes (Signed)
PROGRESS NOTE    Edward Graham  NWG:[id number] DOB: October 12, 1960 DOA: 01/04/2017 PCP: Twana First, MD    Brief Narrative:  56 year old male with a history of colon cancer, alcohol abuse, presents to the hospital with complaints of nausea and vomiting.  He has been taking NSAIDs regularly.  CT of the abdomen done on admission showed duodenal wall thickening and metastatic peritoneal implant in the right lower quadrant.  He was admitted for further treatments.  Seen by GI and underwent EGD that showed Barrett's esophagus, duodenal ulcers.  Currently on Protonix and Carafate.  He is exhibiting some signs of alcohol withdrawal.  Continue to monitor.   Assessment & Plan:   Active Problems:   Malignant neoplasm of ascending colon (HCC)   Melena   Nausea and vomiting   Alcohol abuse   Alcohol withdrawal (HCC)   Acute posthemorrhagic anemia   Duodenal ulcer   1. Nausea and vomiting related to duodenitis/gastritis/esophagitis.  Seen by gastroenterology and underwent EGD today.  Indicated multiple duodenal ulcers.  Likely precipitated by NSAID use as well as ongoing alcohol use.  Continued on Protonix twice daily.  Carafate is also been ordered.  Advance diet as tolerated. 2. Alcohol abuse.  Patient is somewhat tremulous and is exhibiting signs of alcohol withdrawal.  He is on CIWA protocol with Ativan 3. Anemia, likely related to melena and blood loss.  Hemoglobin appears to be stable.  He underwent EGD today that did not show any signs of ongoing bleeding.  Continue to monitor hemoglobin. 4. History of metastatic colon cancer.  Undergoing chemotherapy.  Follow-up with oncology at [facility]. 5. Thrombocytopenia.  Likely related to alcohol use.  Continue to monitor.   DVT prophylaxis: SCDs Code Status: Full code Family Communication: No family present Disposition Plan: Discharge home once improved   Consultants:   Gastroenterology  Procedures:  EGD:- Normal proximal esophagus.                      - LA Grade B reflux esophagitis.                           - Esophageal mucosal changes consistent with                            short-segment Barrett's esophagus. Biopsy not taken.                           - Z-line irregular, 40 cm from the incisors.                           - Portal hypertensive gastropathy.                           - The examination was otherwise normal.                           - Multiple non-bleeding duodenal ulcers with                            pigmented material. Largest ulcer located along  lateral wall of descending duodenum.                           - No specimens collected.     Antimicrobials:      Subjective: Complained to staff with continued abdominal pain, nausea and vomiting.  Appears comfortable during my visit.  Objective: Vitals:   01/05/17 1030 01/05/17 1035 01/05/17 1053 01/05/17 1405  BP: 117/82  121/70 113/73  Pulse: 89 (!) 115 81 (!) 107  Resp: 15 (!) 21 20 (!) 22  Temp:   97.9 F (36.6 C) 98.1 F (36.7 C)  TempSrc:   Oral Oral  SpO2: 100% 98% 100% 98%  Weight:      Height:        Intake/Output Summary (Last 24 hours) at 01/05/2017 1845 Last data filed at 01/05/2017 1300 Gross per 24 hour  Intake 1760.83 ml  Output -  Net 1760.83 ml   Filed Weights   01/04/17 1222 01/04/17 1817  Weight: 83.5 kg (184 lb) 84.5 kg (186 lb 4.8 oz)    Examination:  General exam: Appears calm and comfortable  Respiratory system: Clear to auscultation. Respiratory effort normal. Cardiovascular system: S1 & S2 heard, RRR. No JVD, murmurs, rubs, gallops or clicks. No pedal edema. Gastrointestinal system: Abdomen is nondistended, soft and nontender. No organomegaly or masses felt. Normal bowel sounds heard. Central nervous system: Alert and oriented. No focal neurological deficits. Extremities: Symmetric 5 x 5 power. Skin: No rashes, lesions or ulcers Psychiatry: Judgement and insight appear  normal. Mood & affect appropriate.     Data Reviewed: I have personally reviewed following labs and imaging studies  CBC: Recent Labs  Lab 01/01/17 1716 01/04/17 1251 01/05/17 0610  WBC 8.4 6.4 7.8  NEUTROABS 6.4 5.3 5.8  HGB 12.1* 11.3* 10.9*  HCT 36.5* 34.4* 34.4*  MCV 106.7* 107.2* 109.9*  PLT 135* 93* 564*   Basic Metabolic Panel: Recent Labs  Lab 01/01/17 1716 01/04/17 1250 01/05/17 0610  NA 134* 132* 134*  K 3.5 3.0* 4.6  CL 101 102 104  CO2 25 20* 24  GLUCOSE 86 106* 81  BUN 8 6 7   CREATININE 0.67 0.42* 0.52*  CALCIUM 8.4* 7.4* 7.7*  MG  --  1.6*  --   PHOS  --  1.9*  --    GFR: Estimated Creatinine Clearance: 112.4 mL/min (A) (by C-G formula based on SCr of 0.52 mg/dL (L)). Liver Function Tests: Recent Labs  Lab 01/01/17 1716 01/04/17 1250  AST 33 27  ALT 33 21  ALKPHOS 160* 156*  BILITOT 0.6 0.5  PROT 6.3* 5.2*  ALBUMIN 2.9* 2.4*   Recent Labs  Lab 01/01/17 1716 01/04/17 1250  LIPASE 53* 55*   No results for input(s): AMMONIA in the last 168 hours. Coagulation Profile: No results for input(s): INR, PROTIME in the last 168 hours. Cardiac Enzymes: No results for input(s): CKTOTAL, CKMB, CKMBINDEX, TROPONINI in the last 168 hours. BNP (last 3 results) No results for input(s): PROBNP in the last 8760 hours. HbA1C: No results for input(s): HGBA1C in the last 72 hours. CBG: No results for input(s): GLUCAP in the last 168 hours. Lipid Profile: No results for input(s): CHOL, HDL, LDLCALC, TRIG, CHOLHDL, LDLDIRECT in the last 72 hours. Thyroid Function Tests: No results for input(s): TSH, T4TOTAL, FREET4, T3FREE, THYROIDAB in the last 72 hours. Anemia Panel: No results for input(s): VITAMINB12, FOLATE, FERRITIN, TIBC, IRON, RETICCTPCT in the last 72 hours. Sepsis  Labs: Recent Labs  Lab 01/04/17 1314 01/04/17 1531  LATICACIDVEN 2.72* 3.44*    No results found for this or any previous visit (from the past 240 hour(s)).        Radiology Studies: Ct Abdomen Pelvis W Contrast  Result Date: 01/04/2017 CLINICAL DATA:  Abdominal pain, unspecified. History of colon cancer. EXAM: CT ABDOMEN AND PELVIS WITH CONTRAST TECHNIQUE: Multidetector CT imaging of the abdomen and pelvis was performed using the standard protocol following bolus administration of intravenous contrast. CONTRAST:  132mL ISOVUE-300 IOPAMIDOL (ISOVUE-300) INJECTION 61% COMPARISON:  Abdominopelvic CT 01/01/2017.  PET-CT 11/16/2016. FINDINGS: Lower chest: Mild emphysematous changes at both lung bases. The lung bases are otherwise clear. There is no pleural or pericardial effusion. Hepatobiliary: Diffuse hepatic steatosis again noted. No focal hepatic lesion identified. Hyperdense focus along the inferior aspect of the right hepatic lobe (axial image 44) is unchanged, corresponding with hypermetabolism on PET-CT. There is a small amount of fluid in the subhepatic space. The gallbladder has a stable appearance with mild hyperenhancement, but no significant thickening of its wall. No biliary dilatation. Pancreas: Unremarkable. No pancreatic ductal dilatation or surrounding inflammatory changes. Spleen: Normal in size without focal abnormality. Adrenals/Urinary Tract: Both adrenal glands appear normal. The kidneys appear normal without evidence of urinary tract calculus, suspicious lesion or hydronephrosis. No bladder abnormalities are seen. Stomach/Bowel: Persistent wall thickening of the distal stomach and proximal small bowel involving all segments of the duodenum. There is surrounding soft tissue stranding and retroperitoneal fluid which have mildly increased over the last 3 days. The duodenum demonstrated some wall thickening on the PET-CT of 7 weeks ago as well although this has progressed. Postsurgical changes from right hemicolectomy and anastomosis are stable. There is probable pneumatosis in the proximal transverse colon, unchanged from recent CT. There is a  stable ventral hernia containing the transverse colon. No evidence of incarceration or obstruction. Vascular/Lymphatic: There are no enlarged abdominal or pelvic lymph nodes. Mild aortic and branch vessel atherosclerosis. The portal, superior mesenteric and splenic veins are patent. Reproductive: The prostate gland and seminal vesicles appear unremarkable. Other: Partially calcified right lower quadrant mass again noted, measuring 7.6 x 6.9 cm on image 56. As above, there is a ventral hernia containing transverse colon. In addition, there are bilateral inguinal hernias. There is increased fluid in the right inguinal hernia but no residual herniated bowel. Musculoskeletal: No acute or significant osseous findings. Old rib fractures are present bilaterally. IMPRESSION: 1. Mildly progressive inflammatory changes in the right upper quadrant centered around the duodenum with wall thickening, surrounding inflammation and extraluminal fluid. These changes have progressed over the last 3 days, but were present to some degree on PET-CT of 7 weeks ago. Findings suggest duodenitis of undetermined etiology. Has there been radiation to the right mid abdomen? 2. Probable pneumatosis involving the proximal transverse colon status post right hemicolectomy, similar to previous study of 3 days ago. No evidence of bowel obstruction or perforation. 3. 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 No other evidence of metastatic disease. 4. Stable ventral hernia containing transverse colon. No evidence of bowel obstruction or incarceration. 5. Additional incidental findings are stable, including hepatic steatosis, bilateral inguinal hernias and Aortic Atherosclerosis (ICD10-I70.0). Electronically Signed   By: Richardean Sale M.D.   On: 01/04/2017 15:19        Scheduled Meds: . folic acid  1 mg Oral Daily  .  HYDROmorphone (DILAUDID) injection  0.5 mg  Intravenous Q6H  . lidocaine      . loratadine  10 mg Oral QPM  . meperidine      . midazolam      . multivitamin with minerals  1 tablet Oral Daily  . mupirocin ointment   Topical TID  . nicotine  21 mg Transdermal Daily  . [START ON 01/08/2017] pantoprazole  40 mg Intravenous Q12H  . sucralfate  1 g Oral TID WC & HS  . thiamine  100 mg Oral Daily   Or  . thiamine  100 mg Intravenous Daily   Continuous Infusions: . 0.9 % NaCl with KCl 40 mEq / L 100 mL/hr (01/04/17 2345)  . ciprofloxacin Stopped (01/05/17 1311)  . metronidazole Stopped (01/05/17 1828)     LOS: 1 day    Time spent: 78mins    Kathie Dike, MD [facility] Pager [phone]  If 7PM-7AM, please contact night-coverage [email or web address] Password [facility] 01/05/2017, 6:45 PM

## 2017-01-05 NOTE — Progress Notes (Signed)
Patient has not had any more melena. He vomited clear liquid this morning. He had pain medication and he does not have abdominal pain anymore. Hemoglobin from this morning is 10.9 and platelet count 115K. Abdominal exam pertinent for line scar in the large incisional hernia.  Abdomen is soft and nontender without organomegaly or masses. He has multiple ecchymosis and scratch marks over his upper extremities.  Patient stable for EGD.

## 2017-01-05 NOTE — Op Note (Signed)
[facility] Patient Name: Edward Graham Procedure Date: 01/05/2017 9:29 AM MRN: [id number] Date of Birth: 1960/11/09 Attending MD: Hildred Laser , MD CSN: [id number] Age: 56 Admit Type: Inpatient Procedure:                Upper GI endoscopy Indications:              Acute post hemorrhagic anemia, Melena Providers:                Hildred Laser, MD, Lurline Del, RN, Hinton Rao, RN Referring MD:             Ilene Qua, MD Medicines:                Lidocaine spray, Meperidine 50 mg IV, Midazolam 5                            mg IV, Promethazine 12.5 mg IV, Decadron 4 mg IV                            for itching Complications:            No immediate complications. Estimated Blood Loss:     Estimated blood loss: none. Procedure:                Pre-Anesthesia Assessment:                           - Prior to the procedure, a History and Physical                            was performed, and patient medications and                            allergies were reviewed. The patient's tolerance of                            previous anesthesia was also reviewed. The risks                            and benefits of the procedure and the sedation                            options and risks were discussed with the patient.                            All questions were answered, and informed consent                            was obtained. Prior Anticoagulants: The patient                            last took ibuprofen 1 day prior to the procedure.                            ASA Grade Assessment: III - A patient with severe  systemic disease. After reviewing the risks and                            benefits, the patient was deemed in satisfactory                            condition to undergo the procedure.                           After obtaining informed consent, the endoscope was                            passed under direct vision. Throughout the                     procedure, the patient's blood pressure, pulse, and                            oxygen saturations were monitored continuously. The                            [id number] ([id number]) scope was introduced through the                            mouth, and advanced to the second part of duodenum.                            The upper GI endoscopy was accomplished without                            difficulty. The patient tolerated the procedure                            well. Scope In: 9:57:17 AM Scope Out: 10:03:55 AM Total Procedure Duration: 0 hours 6 minutes 38 seconds  Findings:      The proximal esophagus was normal.      LA Grade B (one or more mucosal breaks greater than 5 mm, not extending       between the tops of two mucosal folds) esophagitis with no bleeding was       found 30 to 37 cm from the incisors.      There were esophageal mucosal changes consistent with short-segment       Barrett's esophagus present in the distal esophagus. The maximum       longitudinal extent of these mucosal changes was 3 cm in length.      The Z-line was irregular and was found 40 cm from the incisors.      Mild portal hypertensive gastropathy was found in the gastric fundus and       in the gastric body.      The exam was otherwise without abnormality.      Many non-bleeding cratered duodenal ulcers with pigmented material were       found in the duodenal bulb and in the second portion of the duodenum.       The largest lesion was 20 mm in largest dimension. Impression:               -  Normal proximal esophagus.                           - LA Grade B reflux esophagitis.                           - Esophageal mucosal changes consistent with                            short-segment Barrett's esophagus. Biopsy not taken.                           - Z-line irregular, 40 cm from the incisors.                           - Portal hypertensive gastropathy.                           - The  examination was otherwise normal.                           - Multiple non-bleeding duodenal ulcers with                            pigmented material. Largest ulcer located along                            lateral wall of descending duodenum.                           - No specimens collected. Moderate Sedation:      Moderate (conscious) sedation was administered by the endoscopy nurse       and supervised by the endoscopist. The following parameters were       monitored: oxygen saturation, heart rate, blood pressure, CO2       capnography and response to care. Total physician intraservice time was       12 minutes. Recommendation:           - Return patient to hospital ward for ongoing care.                           - Full liquid diet today.                           - Continue present medications.                           - Sucralfate 1 gm po ac and qhs.                           - Continue IV Pantoprazole for another 48 hours.                           - H. Pylori serology. Procedure Code(s):        --- Professional ---  18841, Esophagogastroduodenoscopy, flexible,                            transoral; diagnostic, including collection of                            specimen(s) by brushing or washing, when performed                            (separate procedure)                           99152, Moderate sedation services provided by the                            same physician or other qualified health care                            professional performing the diagnostic or                            therapeutic service that the sedation supports,                            requiring the presence of an independent trained                            observer to assist in the monitoring of the                            patient's level of consciousness and physiological                            status; initial 15 minutes of intraservice time,                             patient age 75 years or older Diagnosis Code(s):        --- Professional ---                           K22.8, Other specified diseases of esophagus                           K21.0, Gastro-esophageal reflux disease with                            esophagitis                           K76.6, Portal hypertension                           K31.89, Other diseases of stomach and duodenum                           K26.9, Duodenal ulcer, unspecified as acute or  chronic, without hemorrhage or perforation                           D62, Acute posthemorrhagic anemia                           K92.1, Melena (includes Hematochezia) CPT copyright 2016 [employer]. All rights reserved. The codes documented in this report are preliminary and upon coder review may  be revised to meet current compliance requirements. Hildred Laser, MD Hildred Laser, MD 01/05/2017 10:26:01 AM This report has been signed electronically. Number of Addenda: 0

## 2017-01-06 DIAGNOSIS — R112 Nausea with vomiting, unspecified: Secondary | ICD-10-CM

## 2017-01-06 LAB — CBC
HCT: 30.6 % — ABNORMAL LOW (ref 39.0–52.0)
Hemoglobin: 9.7 g/dL — ABNORMAL LOW (ref 13.0–17.0)
MCH: 35 pg — ABNORMAL HIGH (ref 26.0–34.0)
MCHC: 31.7 g/dL (ref 30.0–36.0)
MCV: 110.5 fL — ABNORMAL HIGH (ref 78.0–100.0)
Platelets: 123 10*3/uL — ABNORMAL LOW (ref 150–400)
RBC: 2.77 MIL/uL — ABNORMAL LOW (ref 4.22–5.81)
RDW: 18.1 % — ABNORMAL HIGH (ref 11.5–15.5)
WBC: 4.4 10*3/uL (ref 4.0–10.5)

## 2017-01-06 LAB — HIV ANTIBODY (ROUTINE TESTING W REFLEX): HIV Screen 4th Generation wRfx: NONREACTIVE

## 2017-01-06 LAB — PROTIME-INR
INR: 1.29
PROTHROMBIN TIME: 16 s — AB (ref 11.4–15.2)

## 2017-01-06 MED ORDER — OXYCODONE-ACETAMINOPHEN 5-325 MG PO TABS
2.0000 | ORAL_TABLET | ORAL | Status: DC | PRN
  Administered 2017-01-06 – 2017-01-07 (×7): 2 via ORAL
  Filled 2017-01-06 (×7): qty 2

## 2017-01-06 NOTE — Progress Notes (Signed)
   Subjective:  Patient feels better.  He states he had pain medication about an hour ago and he does not have pain anymore.  He says most of his pain is in mid and lower abdomen.  He had one bowel movement this morning.  Was small and not as black as before.  He denies nausea or vomiting.  He states his appetite is normal.  At home he has been taking oxycodone and ibuprofen for pain.  Objective: Blood pressure 122/64, pulse 98, temperature 98.4 F (36.9 C), temperature source Oral, resp. rate 20, height 5\' 11"  (1.803 m), weight 186 lb 4.8 oz (84.5 kg), SpO2 97 %. Patient is alert and in no acute distress. Abdominal examination reveals large mid abdominal incisional hernia.  Abdomen is soft with mild midepigastric tenderness on deep palpation.  No organomegaly or masses. He has multiple ecchymosis and scratch marks over his upper extremities.   Labs/studies Results:  Recent Labs    2017-01-27 0610 01-27-17 1659 01/06/17 0849  WBC 7.8 4.6 4.4  HGB 10.9* 10.3* 9.7*  HCT 34.4* 32.0* 30.6*  PLT 115* 100* 123*    BMET  Recent Labs    01/04/17 1250 01/27/17 0610  NA 132* 134*  K 3.0* 4.6  CL 102 104  CO2 20* 24  GLUCOSE 106* 81  BUN 6 7  CREATININE 0.42* 0.52*  CALCIUM 7.4* 7.7*    LFT  Recent Labs    01/04/17 1250  PROT 5.2*  ALBUMIN 2.4*  AST 27  ALT 21  ALKPHOS 156*  BILITOT 0.5    PT/INR  Recent Labs    01/06/17 0617  LABPROT 16.0*  INR 1.29     Assessment:  #1.  Upper GI bleed secondary to multiple duodenal ulcers most likely secondary to NSAID use.  Chemotherapy may also have contributed. H. pylori serology is pending.  He did not require therapeutic intervention. He is afebrile and does not have leukocytosis.  Abdominal exam does not suggest peritonitis.   #2.  Anemia secondary to GI bleed.  He has not required transfusion as of now.  #3.  GERD complicated by short segment Barrett's esophagus.  He also had multiple esophageal erosions.  His mucosa was  not biopsied.  Abnormal PET findings in relation to esophagus would appear to be due to esophagitis.  #4.  Metastatic carcinoma of colon.  Recent PET scan showed hyper metabolic activity in the right lower quadrant; new hypermetabolic right peritoneal node just below the right lobe of liver.     Recommendations:  Advance diet to heart healthy diet. Discontinue antibiotics. CBC and metabolic 7 in a.m. Continue IV pantoprazole for another 24 hours. Patient counseled regarding his alcohol intake.  Patient advised not to drink alcohol.

## 2017-01-06 NOTE — Progress Notes (Signed)
PROGRESS NOTE    Edward Graham  NFA:[id number] DOB: Aug 19, 1960 DOA: 01/04/2017 PCP: Twana First, MD    Brief Narrative:  56 year old male with a history of colon cancer, alcohol abuse, presents to the hospital with complaints of nausea and vomiting.  He has been taking NSAIDs regularly.  CT of the abdomen done on admission showed duodenal wall thickening and metastatic peritoneal implant in the right lower quadrant.  He was admitted for further treatments.  Seen by GI and underwent EGD that showed Barrett's esophagus, duodenal ulcers.  Currently on Protonix and Carafate.  He is exhibiting some signs of alcohol withdrawal.  Continue to monitor.   Assessment & Plan:   Active Problems:   Malignant neoplasm of ascending colon (HCC)   Melena   Nausea and vomiting   Alcohol abuse   Alcohol withdrawal (HCC)   Acute posthemorrhagic anemia   Duodenal ulcer   Thrombocytopenia ([place])   1. Nausea and vomiting related to duodenitis/gastritis/esophagitis.  Seen by gastroenterology and underwent EGD.  Indicated multiple duodenal ulcers.  Likely precipitated by NSAID use as well as ongoing alcohol use.  Continued on Protonix twice daily.  Carafate is also been ordered.  Advance diet as tolerated.  We will transition pain medications to oral. 2. Alcohol abuse.  No signs of withdrawal at this time.  He is on CIWA protocol with Ativan 3. Anemia, likely related to melena and blood loss.  Hemoglobin appears to be stable.  He underwent EGDthat did not show any signs of ongoing bleeding.  Continue to monitor hemoglobin. 4. History of metastatic colon cancer.  Undergoing chemotherapy.  Follow-up with oncology at [facility]. 5. Thrombocytopenia.  Likely related to alcohol use.  Continue to monitor.   DVT prophylaxis: SCDs Code Status: Full code Family Communication: No family present Disposition Plan: Discharge home once improved   Consultants:   Gastroenterology  Procedures:  EGD:- Normal  proximal esophagus.                           - LA Grade B reflux esophagitis.                           - Esophageal mucosal changes consistent with                            short-segment Barrett's esophagus. Biopsy not taken.                           - Z-line irregular, 40 cm from the incisors.                           - Portal hypertensive gastropathy.                           - The examination was otherwise normal.                           - Multiple non-bleeding duodenal ulcers with                            pigmented material. Largest ulcer located along  lateral wall of descending duodenum.                           - No specimens collected.     Antimicrobials:      Subjective: Tolerating full liquids.  No vomiting.  Still has some abdominal pain.  Objective: Vitals:   01/05/17 1053 01/05/17 1405 01/05/17 2152 01/06/17 0603  BP: 121/70 113/73 118/68 122/64  Pulse: 81 (!) 107 (!) 101 98  Resp: 20 (!) 22 20 20   Temp: 97.9 F (36.6 C) 98.1 F (36.7 C) 98 F (36.7 C) 98.4 F (36.9 C)  TempSrc: Oral Oral Oral Oral  SpO2: 100% 98% 98% 97%  Weight:      Height:        Intake/Output Summary (Last 24 hours) at 01/06/2017 1503 Last data filed at 01/06/2017 1015 Gross per 24 hour  Intake 3658.33 ml  Output -  Net 3658.33 ml   Filed Weights   01/04/17 1222 01/04/17 1817  Weight: 83.5 kg (184 lb) 84.5 kg (186 lb 4.8 oz)    Examination:  General exam: Appears calm and comfortable  Respiratory system: Clear to auscultation. Respiratory effort normal. Cardiovascular system: S1 & S2 heard, RRR. No JVD, murmurs, rubs, gallops or clicks. No pedal edema. Gastrointestinal system: Large mid abdominal incisional hernia.  Abdomen is soft, epigastric tenderness. Normal bowel sounds heard. Central nervous system: Alert and oriented. No focal neurological deficits. Extremities: Symmetric 5 x 5 power. Skin: No rashes, lesions or  ulcers Psychiatry: Judgement and insight appear normal. Mood & affect appropriate.     Data Reviewed: I have personally reviewed following labs and imaging studies  CBC: Recent Labs  Lab 01/01/17 1716 01/04/17 1251 01/05/17 0610 01/05/17 1659 01/06/17 0849  WBC 8.4 6.4 7.8 4.6 4.4  NEUTROABS 6.4 5.3 5.8  --   --   HGB 12.1* 11.3* 10.9* 10.3* 9.7*  HCT 36.5* 34.4* 34.4* 32.0* 30.6*  MCV 106.7* 107.2* 109.9* 110.3* 110.5*  PLT 135* 93* 115* 100* 371*   Basic Metabolic Panel: Recent Labs  Lab 01/01/17 1716 01/04/17 1250 01/05/17 0610  NA 134* 132* 134*  K 3.5 3.0* 4.6  CL 101 102 104  CO2 25 20* 24  GLUCOSE 86 106* 81  BUN 8 6 7   CREATININE 0.67 0.42* 0.52*  CALCIUM 8.4* 7.4* 7.7*  MG  --  1.6*  --   PHOS  --  1.9*  --    GFR: Estimated Creatinine Clearance: 112.4 mL/min (A) (by C-G formula based on SCr of 0.52 mg/dL (L)). Liver Function Tests: Recent Labs  Lab 01/01/17 1716 01/04/17 1250  AST 33 27  ALT 33 21  ALKPHOS 160* 156*  BILITOT 0.6 0.5  PROT 6.3* 5.2*  ALBUMIN 2.9* 2.4*   Recent Labs  Lab 01/01/17 1716 01/04/17 1250  LIPASE 53* 55*   No results for input(s): AMMONIA in the last 168 hours. Coagulation Profile: Recent Labs  Lab 01/06/17 0617  INR 1.29   Cardiac Enzymes: No results for input(s): CKTOTAL, CKMB, CKMBINDEX, TROPONINI in the last 168 hours. BNP (last 3 results) No results for input(s): PROBNP in the last 8760 hours. HbA1C: No results for input(s): HGBA1C in the last 72 hours. CBG: No results for input(s): GLUCAP in the last 168 hours. Lipid Profile: No results for input(s): CHOL, HDL, LDLCALC, TRIG, CHOLHDL, LDLDIRECT in the last 72 hours. Thyroid Function Tests: No results for input(s): TSH, T4TOTAL, FREET4, T3FREE, THYROIDAB in the  last 72 hours. Anemia Panel: No results for input(s): VITAMINB12, FOLATE, FERRITIN, TIBC, IRON, RETICCTPCT in the last 72 hours. Sepsis Labs: Recent Labs  Lab 01/04/17 1314 01/04/17 1531   LATICACIDVEN 2.72* 3.44*    No results found for this or any previous visit (from the past 240 hour(s)).       Radiology Studies: No results found.      Scheduled Meds: . folic acid  1 mg Oral Daily  . hyoscyamine  0.375 mg Oral Q12H  . loratadine  10 mg Oral QPM  . multivitamin with minerals  1 tablet Oral Daily  . mupirocin ointment   Topical TID  . nicotine  21 mg Transdermal Daily  . [START ON 01/08/2017] pantoprazole  40 mg Intravenous Q12H  . sucralfate  1 g Oral TID WC & HS  . thiamine  100 mg Oral Daily   Or  . thiamine  100 mg Intravenous Daily   Continuous Infusions: . 0.9 % NaCl with KCl 40 mEq / L 50 mL/hr (01/06/17 1231)     LOS: 2 days    Time spent: 103mins    Kathie Dike, MD [facility] Pager [phone]  If 7PM-7AM, please contact night-coverage [email or web address] Password [facility] 01/06/2017, 3:03 PM

## 2017-01-07 ENCOUNTER — Ambulatory Visit (HOSPITAL_COMMUNITY): Payer: BLUE CROSS/BLUE SHIELD

## 2017-01-07 ENCOUNTER — Telehealth: Payer: Self-pay | Admitting: Gastroenterology

## 2017-01-07 ENCOUNTER — Encounter (HOSPITAL_COMMUNITY): Payer: Self-pay | Admitting: Internal Medicine

## 2017-01-07 DIAGNOSIS — K269 Duodenal ulcer, unspecified as acute or chronic, without hemorrhage or perforation: Principal | ICD-10-CM

## 2017-01-07 LAB — BASIC METABOLIC PANEL
ANION GAP: 6 (ref 5–15)
BUN: 10 mg/dL (ref 6–20)
CO2: 25 mmol/L (ref 22–32)
Calcium: 7.5 mg/dL — ABNORMAL LOW (ref 8.9–10.3)
Chloride: 107 mmol/L (ref 101–111)
Creatinine, Ser: 0.48 mg/dL — ABNORMAL LOW (ref 0.61–1.24)
GFR calc non Af Amer: >60 mL/min (ref >60)
Glucose, Bld: 116 mg/dL — ABNORMAL HIGH (ref 65–99)
Potassium: 3.9 mmol/L (ref 3.5–5.1)
Sodium: 138 mmol/L (ref 135–145)

## 2017-01-07 LAB — CBC
HEMATOCRIT: 26.8 % — AB (ref 39.0–52.0)
HEMATOCRIT: 28 % — AB (ref 39.0–52.0)
Hemoglobin: 8.5 g/dL — ABNORMAL LOW (ref 13.0–17.0)
Hemoglobin: 9 g/dL — ABNORMAL LOW (ref 13.0–17.0)
MCH: 35.3 pg — ABNORMAL HIGH (ref 26.0–34.0)
MCH: 35.4 pg — ABNORMAL HIGH (ref 26.0–34.0)
MCHC: 31.7 g/dL (ref 30.0–36.0)
MCHC: 32.1 g/dL (ref 30.0–36.0)
MCV: 111.2 fL — ABNORMAL HIGH (ref 78.0–100.0)
MCV: 110.2 fL — AB (ref 78.0–100.0)
Platelets: 101 10*3/uL — ABNORMAL LOW (ref 150–400)
Platelets: 118 10*3/uL — ABNORMAL LOW (ref 150–400)
RBC: 2.41 MIL/uL — ABNORMAL LOW (ref 4.22–5.81)
RBC: 2.54 MIL/uL — ABNORMAL LOW (ref 4.22–5.81)
RDW: 18.5 % — AB (ref 11.5–15.5)
RDW: 18.1 % — ABNORMAL HIGH (ref 11.5–15.5)
WBC: 2.5 10*3/uL — AB (ref 4.0–10.5)
WBC: 2.9 10*3/uL — ABNORMAL LOW (ref 4.0–10.5)

## 2017-01-07 MED ORDER — PANTOPRAZOLE SODIUM 40 MG PO TBEC
40.0000 mg | DELAYED_RELEASE_TABLET | Freq: Two times a day (BID) | ORAL | Status: DC
  Administered 2017-01-07: 40 mg via ORAL
  Filled 2017-01-07: qty 1

## 2017-01-07 MED ORDER — HYOSCYAMINE SULFATE ER 0.375 MG PO TB12: 0.3750 mg | ORAL_TABLET | Freq: Two times a day (BID) | ORAL | 0 refills | Status: AC

## 2017-01-07 MED ORDER — SUCRALFATE 1 GM/10ML PO SUSP: 1.0000 g | Freq: Three times a day (TID) | ORAL | 0 refills | Status: AC

## 2017-01-07 MED ORDER — LIDOCAINE VISCOUS 2 % MT SOLN: 15.0000 mL | OROMUCOSAL | 0 refills | Status: AC | PRN

## 2017-01-07 MED ORDER — PANTOPRAZOLE SODIUM 40 MG PO TBEC: 40.0000 mg | DELAYED_RELEASE_TABLET | Freq: Two times a day (BID) | ORAL | 1 refills | Status: AC

## 2017-01-07 MED ORDER — HEPARIN SOD (PORK) LOCK FLUSH 100 UNIT/ML IV SOLN
500.0000 [IU] | INTRAVENOUS | Status: DC | PRN
  Administered 2017-01-07: 500 [IU]
  Filled 2017-01-07: qty 5

## 2017-01-07 MED ORDER — OXYCODONE-ACETAMINOPHEN 5-325 MG PO TABS: 1.0000 | ORAL_TABLET | ORAL | 0 refills | Status: AC | PRN

## 2017-01-07 NOTE — Discharge Summary (Signed)
Physician Discharge Summary  Edward Graham VQQ:[id number] DOB: 1960/11/03 DOA: 01/04/2017  PCP: Twana First, MD  Admit date: 01/04/2017 Discharge date: 01/07/2017  Admitted From: Home Disposition: Home  Recommendations for Outpatient Follow-up:  1. Follow up with PCP in 1-2 weeks 2. Please obtain BMP/CBC in one week 3. Follow-up with oncology as previously scheduled for further chemotherapy 4. Follow-up with gastroenterology on H. pylori serology  Discharge Condition: Stable CODE STATUS: Full code Diet recommendation: Heart Healthy   Brief/Interim Summary: 56 year old male with a history of colon cancer, alcohol abuse, presents to the hospital with complaints of nausea and vomiting.  He has been taking NSAIDs regularly.  CT of the abdomen done on admission showed duodenal wall thickening and metastatic peritoneal implant in the right lower quadrant.  He was admitted for further treatments.  Seen by GI and underwent EGD that showed Barrett's esophagus, duodenal ulcers.  Currently on Protonix and Carafate.   Discharge Diagnoses:  Active Problems:   Malignant neoplasm of ascending colon (HCC)   Melena   Nausea and vomiting   Alcohol abuse   Alcohol withdrawal (HCC)   Acute posthemorrhagic anemia   Duodenal ulcer   Thrombocytopenia ([place])   1. Nausea and vomiting related to duodenitis/gastritis/esophagitis.  Seen by gastroenterology and underwent EGD.  Indicated multiple duodenal ulcers.  Likely precipitated by NSAID use as well as ongoing alcohol use.  Continued on Protonix twice daily and Carafate.  Diet was advanced and he is tolerating a solid diet.  He was given viscous lidocaine which also improved his pain.  He been advised to refrain from further NSAIDs or alcohol.  He will follow-up with GI as an outpatient. 2. Alcohol abuse.  No signs of withdrawal at this time.  He  was treated with CIWA protocol with Ativan 3. Anemia, likely related to melena and blood loss.  Hemoglobin  appears to be stable.  He underwent EGD that did not show any signs of ongoing bleeding.  Continue to monitor hemoglobin. 4. History of metastatic colon cancer.  Undergoing chemotherapy.  Follow-up with oncology. 5. Thrombocytopenia.  Likely related to alcohol use.    Stable.   Discharge Instructions   Allergies as of 01/07/2017      Reactions   Dust Mite Extract    Mold Extract [trichophyton] Other (See Comments)   Tree Extract    All trees, grass, cockroaches, chemicals, perfumes, feathers      Medication List    STOP taking these medications   ciprofloxacin 500 MG tablet Commonly known as:  CIPRO   metroNIDAZOLE 500 MG tablet Commonly known as:  FLAGYL   Potassium 99 MG Tabs     TAKE these medications   albuterol 108 (90 Base) MCG/ACT inhaler Commonly known as:  PROVENTIL HFA;VENTOLIN HFA Inhale 2 puffs into the lungs every 6 (six) hours as needed for wheezing or shortness of breath.   calcium-vitamin D 500-200 MG-UNIT tablet Commonly known as:  OSCAL WITH D Take 1 tablet by mouth daily with breakfast.   dexamethasone 4 MG tablet Commonly known as:  DECADRON Take 2 tablets (8 mg total) by mouth daily. Start the day after chemotherapy for 2 days. Take with food.   diphenoxylate-atropine 2.5-0.025 MG tablet Commonly known as:  LOMOTIL Take 1 tablet by mouth 4 (four) times daily as needed for diarrhea or loose stools.   fluorouracil CALGB 43329 in sodium chloride 0.9 % 150 mL Inject into the vein. Over 46 hours   hydrOXYzine 25 MG tablet Commonly known  as:  ATARAX/VISTARIL Take 1 tablet (25 mg total) by mouth every 6 (six) hours as needed for itching.   hyoscyamine 0.375 MG 12 hr tablet Commonly known as:  LEVBID Take 1 tablet (0.375 mg total) by mouth every 12 (twelve) hours.   LEUCOVORIN CALCIUM IV Inject into the vein. Every 2 weeks   levocetirizine 5 MG tablet Commonly known as:  XYZAL Take 5 mg by mouth every evening.   lidocaine 2 %  solution Commonly known as:  XYLOCAINE Use as directed 15 mLs in the mouth or throat every 4 (four) hours as needed for mouth pain.   lidocaine-prilocaine cream Commonly known as:  EMLA Apply to affected area once   mupirocin ointment 2 % Commonly known as:  BACTROBAN APPLY TO AFFECTED AREA AND INSIDE NOSE UP TO 3 TIMES A DAY AS NEEDED   ondansetron 8 MG tablet Commonly known as:  ZOFRAN Take 1 tablet (8 mg total) by mouth every 4 (four) hours as needed.   OXALIPLATIN IV Inject into the vein. Every 2 weeks   oxyCODONE-acetaminophen 5-325 MG tablet Commonly known as:  PERCOCET Take 1-2 tablets by mouth every 4 (four) hours as needed.   pantoprazole 40 MG tablet Commonly known as:  PROTONIX Take 1 tablet (40 mg total) by mouth 2 (two) times daily before a meal.   potassium chloride SA 20 MEQ tablet Commonly known as:  K-DUR,KLOR-CON Take 2 tablets (40 mEq total) by mouth daily.   prochlorperazine 10 MG tablet Commonly known as:  COMPAZINE Take 1 tablet (10 mg total) by mouth every 6 (six) hours as needed (Nausea or vomiting).   sucralfate 1 GM/10ML suspension Commonly known as:  CARAFATE Take 10 mLs (1 g total) by mouth 4 (four) times daily -  with meals and at bedtime.   THERA Tabs Take 1 tablet by mouth daily.   triamcinolone cream 0.1 % Commonly known as:  KENALOG APPLY TO AFFECTED AREA UP TO TWICE A DAY AS NEEDED (NOT TO FACE, GROIN, OR UNDERARMS)      Follow-up Information    Fields, Marga Melnick, MD Follow up.   Specialty:  Gastroenterology Why:  they will call you with appointment Contact information: [place] [address] [phone]        Twana First, MD Follow up.   Specialty:  Oncology Why:  follow up with further cancer treatments as directed Contact information: [place] [address] [phone]          Allergies  Allergen Reactions  . Dust Mite Extract   . Mold Extract [Trichophyton] Other (See  Comments)  . Tree Extract     All trees, grass, cockroaches, chemicals, perfumes, feathers    Consultations:  Gastroenterology   Procedures/Studies: Ct Abdomen Pelvis W Contrast  Result Date: 01/04/2017 CLINICAL DATA:  Abdominal pain, unspecified. History of colon cancer. EXAM: CT ABDOMEN AND PELVIS WITH CONTRAST TECHNIQUE: Multidetector CT imaging of the abdomen and pelvis was performed using the standard protocol following bolus administration of intravenous contrast. CONTRAST:  114mL ISOVUE-300 IOPAMIDOL (ISOVUE-300) INJECTION 61% COMPARISON:  Abdominopelvic CT 01/01/2017.  PET-CT 11/16/2016. FINDINGS: Lower chest: Mild emphysematous changes at both lung bases. The lung bases are otherwise clear. There is no pleural or pericardial effusion. Hepatobiliary: Diffuse hepatic steatosis again noted. No focal hepatic lesion identified. Hyperdense focus along the inferior aspect of the right hepatic lobe (axial image 44) is unchanged, corresponding with hypermetabolism on PET-CT. There is a small amount of fluid in  the subhepatic space. The gallbladder has a stable appearance with mild hyperenhancement, but no significant thickening of its wall. No biliary dilatation. Pancreas: Unremarkable. No pancreatic ductal dilatation or surrounding inflammatory changes. Spleen: Normal in size without focal abnormality. Adrenals/Urinary Tract: Both adrenal glands appear normal. The kidneys appear normal without evidence of urinary tract calculus, suspicious lesion or hydronephrosis. No bladder abnormalities are seen. Stomach/Bowel: Persistent wall thickening of the distal stomach and proximal small bowel involving all segments of the duodenum. There is surrounding soft tissue stranding and retroperitoneal fluid which have mildly increased over the last 3 days. The duodenum demonstrated some wall thickening on the PET-CT of 7 weeks ago as well although this has progressed. Postsurgical changes from right hemicolectomy  and anastomosis are stable. There is probable pneumatosis in the proximal transverse colon, unchanged from recent CT. There is a stable ventral hernia containing the transverse colon. No evidence of incarceration or obstruction. Vascular/Lymphatic: There are no enlarged abdominal or pelvic lymph nodes. Mild aortic and branch vessel atherosclerosis. The portal, superior mesenteric and splenic veins are patent. Reproductive: The prostate gland and seminal vesicles appear unremarkable. Other: Partially calcified right lower quadrant mass again noted, measuring 7.6 x 6.9 cm on image 56. As above, there is a ventral hernia containing transverse colon. In addition, there are bilateral inguinal hernias. There is increased fluid in the right inguinal hernia but no residual herniated bowel. Musculoskeletal: No acute or significant osseous findings. Old rib fractures are present bilaterally. IMPRESSION: 1. Mildly progressive inflammatory changes in the right upper quadrant centered around the duodenum with wall thickening, surrounding inflammation and extraluminal fluid. These changes have progressed over the last 3 days, but were present to some degree on PET-CT of 7 weeks ago. Findings suggest duodenitis of undetermined etiology. Has there been radiation to the right mid abdomen? 2. Probable pneumatosis involving the proximal transverse colon status post right hemicolectomy, similar to previous study of 3 days ago. No evidence of bowel obstruction or perforation. 3. 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 No other evidence of metastatic disease. 4. Stable ventral hernia containing transverse colon. No evidence of bowel obstruction or incarceration. 5. Additional incidental findings are stable, including hepatic steatosis, bilateral inguinal hernias and Aortic Atherosclerosis (ICD10-I70.0). Electronically Signed   By: Richardean Sale M.D.   On:  01/04/2017 15:19   Ct Abdomen Pelvis W Contrast  Result Date: 01/01/2017 CLINICAL DATA:  Diffuse abdominal pain with history of colon cancer EXAM: CT ABDOMEN AND PELVIS WITH CONTRAST TECHNIQUE: Multidetector CT imaging of the abdomen and pelvis was performed using the standard protocol following bolus administration of intravenous contrast. CONTRAST:  170mL ISOVUE-300 IOPAMIDOL (ISOVUE-300) INJECTION 61% COMPARISON:  PET-CT 11/16/2016,  06/08/2016 FINDINGS: Lower chest: Lung bases demonstrate no acute consolidation or pleural effusion. Normal heart size. Hepatobiliary: Diffuse steatosis. No calcified gallstones. No biliary dilatation. Pancreas: No ductal dilatation. Spleen: Normal in size without focal abnormality. Adrenals/Urinary Tract: Adrenal glands are within normal limits. Kidneys are unremarkable. Bladder within normal limits Stomach/Bowel: The stomach is nonenlarged. Suspected wall thickening of the duodenum bulb, second portion of duodenum and proximal third portion of duodenum. Inflammation and fluid in the right upper quadrant of the abdomen. No dilated small bowel. Status post right hemicolectomy with anastomosis in the right lower quadrant. Peripheral orientation of gas within the proximal transverse colon. Vascular/Lymphatic: Aortic atherosclerosis. No aneurysmal dilatation. No significantly enlarged lymph nodes Reproductive: Prostate is unremarkable. Other: Negative for free air.  Moderate left fatty inguinal hernia. Right inguinal hernia contains small bowel but no evidence for obstruction. Similar size and appearance of a a right lower quadrant soft tissue mass measuring 8.1 x 5.9 by 9.3 cm with peripheral calcification. Inflammation in the right upper quadrant of the abdomen with some fluid and inflammation extending inferiorly along the right anterior pararenal space. Ventral hernia containing mesentery and transverse colon. 15 mm hyperenhancing soft tissue nodule adjacent to the tip of the  liver. Musculoskeletal: No acute or suspicious finding. Old left-sided rib fractures. IMPRESSION: 1. Inflammatory process centered within the right upper quadrant of the abdomen ; the duodenum appears thickened, findings could be secondary to duodenitis/peptic ulcer disease. No extraluminal gas to suggest perforation. Acute gallbladder disease could also be considered given proximity to inflammatory changes, could correlate with ultrasound. Fluid in the anterior pararenal space, could be seen with pancreatitis and suggest correlation with enzymes. 2. Status post right colectomy. Probable pneumatosis involving the proximal transverse colon. There is no portal venous gas present and no free air. Pneumatosis can be seen in association with chemotherapy, however recommend correlation with lactic acid as ischemia could also produce pneumatosis 3. Stable partially calcified right lower quadrant mass with additional 1.5 cm hyperenhancing peritoneal nodule inferior to the right lobe of the liver as before. 4. Large ventral hernia containing mesentery and transverse colon but no obstruction. Bowel containing right inguinal hernia, also without obstruction 5. Hepatic steatosis Electronically Signed   By: Donavan Foil M.D.   On: 01/01/2017 19:39    EGD:- Normal proximal esophagus. - LA Grade B reflux esophagitis. - Esophageal mucosal changes consistent with  short-segment Barrett's esophagus. Biopsy not taken. - Z-line irregular, 40 cm from the incisors. - Portal hypertensive gastropathy. - The examination was otherwise normal. - Multiple non-bleeding duodenal ulcers with  pigmented material. Largest ulcer located along  lateral wall of descending  duodenum. - No specimens collected.     Subjective: Feeling better.  Overall wants to go home.  No vomiting.  Tolerating solid diet.  Still having some abdominal pain.  Discharge Exam: Vitals:   01/06/17 1555 01/06/17 2027  BP: 119/62 101/64  Pulse: 89 83  Resp: 20 20  Temp: 98.6 F (37 C) 98 F (36.7 C)  SpO2: 98% 100%   Vitals:   01/05/17 2152 01/06/17 0603 01/06/17 1555 01/06/17 2027  BP: 118/68 122/64 119/62 101/64  Pulse: (!) 101 98 89 83  Resp: 20 20 20 20   Temp: 98 F (36.7 C) 98.4 F (36.9 C) 98.6 F (37 C) 98 F (36.7 C)  TempSrc: Oral Oral  Oral  SpO2: 98% 97% 98% 100%  Weight:      Height:        General: Pt is alert, awake, not in acute distress Cardiovascular: RRR, S1/S2 +, no rubs, no gallops Respiratory: CTA bilaterally, no wheezing, no rhonchi Abdominal: Large mid abdominal incisional hernia.  Abdomen is soft, epigastric tenderness.  Normal bowel sounds heard. Extremities: no edema, no cyanosis    The results of significant diagnostics from this hospitalization (including imaging, microbiology, ancillary and laboratory) are listed below for reference.     Microbiology: No results found for this or any previous visit (from the past 240 hour(s)).   Labs: BNP (last 3 results) No results for input(s): BNP in the last 8760 hours. Basic Metabolic Panel: Recent Labs  Lab 01/01/17 1716 01/04/17 1250 01/05/17 0610 01/07/17 0523  NA 134* 132* 134* 138  K 3.5 3.0* 4.6 3.9  CL 101 102 104 107  CO2 25 20* 24 25  GLUCOSE 86 106* 81 116*  BUN 8 6 7 10   CREATININE 0.67 0.42* 0.52* 0.48*  CALCIUM 8.4* 7.4* 7.7* 7.5*  MG  --  1.6*  --   --   PHOS  --  1.9*  --   --    Liver Function Tests: Recent Labs  Lab 01/01/17 1716 01/04/17 1250  AST 33 27  ALT 33 21  ALKPHOS 160* 156*  BILITOT 0.6 0.5  PROT 6.3* 5.2*  ALBUMIN 2.9* 2.4*   Recent Labs  Lab 01/01/17 1716 01/04/17 1250  LIPASE 53* 55*   No results for  input(s): AMMONIA in the last 168 hours. CBC: Recent Labs  Lab 01/01/17 1716 01/04/17 1251 01/05/17 0610 01/05/17 1659 01/06/17 0849 01/07/17 0523 01/07/17 1148  WBC 8.4 6.4 7.8 4.6 4.4 2.5* 2.9*  NEUTROABS 6.4 5.3 5.8  --   --   --   --   HGB 12.1* 11.3* 10.9* 10.3* 9.7* 8.5* 9.0*  HCT 36.5* 34.4* 34.4* 32.0* 30.6* 26.8* 28.0*  MCV 106.7* 107.2* 109.9* 110.3* 110.5* 111.2* 110.2*  PLT 135* 93* 115* 100* 123* 101* 118*   Cardiac Enzymes: No results for input(s): CKTOTAL, CKMB, CKMBINDEX, TROPONINI in the last 168 hours. BNP: Invalid input(s): POCBNP CBG: No results for input(s): GLUCAP in the last 168 hours. D-Dimer No results for input(s): DDIMER in the last 72 hours. Hgb A1c No results for input(s): HGBA1C in the last 72 hours. Lipid Profile No results for input(s): CHOL, HDL, LDLCALC, TRIG, CHOLHDL, LDLDIRECT in the last 72 hours. Thyroid function studies No results for input(s): TSH, T4TOTAL, T3FREE, THYROIDAB in the last 72 hours.  Invalid input(s): FREET3 Anemia work up No results for input(s): VITAMINB12, FOLATE, FERRITIN, TIBC, IRON, RETICCTPCT in the last 72 hours. Urinalysis    Component Value Date/Time   COLORURINE YELLOW 01/04/2017 1224   APPEARANCEUR CLEAR 01/04/2017 1224   LABSPEC >1.046 (H) 01/04/2017 1224   PHURINE 5.0 01/04/2017 1224   GLUCOSEU NEGATIVE 01/04/2017 1224   HGBUR NEGATIVE 01/04/2017 [place] 01/04/2017 1224   KETONESUR NEGATIVE 01/04/2017 1224   PROTEINUR NEGATIVE 01/04/2017 1224   NITRITE NEGATIVE 01/04/2017 1224   LEUKOCYTESUR NEGATIVE 01/04/2017 1224   Sepsis Labs Invalid input(s): PROCALCITONIN,  WBC,  LACTICIDVEN Microbiology No results found for this or any previous visit (from the past 240 hour(s)).   Time coordinating discharge: Over 30 minutes  SIGNED:   Kathie Dike, MD  [facility] 01/07/2017, 1:59 PM Pager   If 7PM-7AM, please contact night-coverage [email or web address] Password  [email or web address]

## 2017-01-07 NOTE — Progress Notes (Signed)
Pt's port flushed prior to discharge. Pt's port deaccessed and site is clean dry and intact. Discharge instructions including medications and follow up appointments were reviewed and discussed with patient. All questions were answered and no further questions at this time. Pt in stable condition and in no acute distress at time of discharge. Pt will be escorted by nurse tech.

## 2017-01-07 NOTE — Telephone Encounter (Signed)
Please have patient return for hospital follow-up in 4-6 weeks.

## 2017-01-07 NOTE — Progress Notes (Signed)
REVIEWED. Duodenal ulcers due to ibuprofen and ?5FUfu. Await h pylori serology. OPV IN 4-6 WEEKS.   Subjective: Abdominal pain improved but still present. Now pain is just located above umbilicus like a band across abdomen. No N/V. States he is worried about what he will be discharged with for pain control. Tolerating diet. States he is "perturbed" due to changes in pain medication during admission. No overt GI bleeding.   Objective: Vital signs in last 24 hours: Temp:  [98 F (36.7 C)-98.6 F (37 C)] 98 F (36.7 C) (12/02 2027) Pulse Rate:  [83-89] 83 (12/02 2027) Resp:  [20] 20 (12/02 2027) BP: (101-119)/(62-64) 101/64 (12/02 2027) SpO2:  [98 %-100 %] 100 % (12/02 2027) Last BM Date: 01/05/17 General:   Alert and oriented, anxious affect.  Head:  Normocephalic and atraumatic. Abdomen:  Bowel sounds present, distended but soft, TTP across mid abdomen, large protruding ventral hernia that is chronic.  Msk:  Symmetrical without gross deformities. Normal posture. Neurologic:  Alert and  oriented x4 Psych:  Alert and cooperative.   Intake/Output from previous day: 12/02 0701 - 12/03 0700 In: 3898.3 [P.O.:480; I.V.:3118.3; IV Piggyback:300] Out: -  Intake/Output this shift: No intake/output data recorded.  Lab Results: Recent Labs    01/05/17 1659 01/06/17 0849 01/07/17 0523  WBC 4.6 4.4 2.5*  HGB 10.3* 9.7* 8.5*  HCT 32.0* 30.6* 26.8*  PLT 100* 123* 101*   BMET Recent Labs    01/04/17 1250 01/05/17 0610 01/07/17 0523  NA 132* 134* 138  K 3.0* 4.6 3.9  CL 102 104 107  CO2 20* 24 25  GLUCOSE 106* 81 116*  BUN 6 7 10   CREATININE 0.42* 0.52* 0.48*  CALCIUM 7.4* 7.7* 7.5*   LFT Recent Labs    01/04/17 1250  PROT 5.2*  ALBUMIN 2.4*  AST 27  ALT 21  ALKPHOS 156*  BILITOT 0.5   PT/INR Recent Labs    01/06/17 0617  LABPROT 16.0*  INR 1.29     Assessment: 56 year old male with metastatic colon cancer originally diagnosed in 2016, history of alcohol  abuse, presenting with abdominal pain and melena. EGD completed with reflux esophagitis and mucosal changes consistent with short-segment Barrett's, no biopsy obtained. Portal hypertensive gastropathy also noted, along with multiple non-bleeding duodenal ulcers likely secondary to NSAID use. H.pylori serology pending.   Anemia: in setting of duodenal ulcers. Hgb trending down but without overt GI bleeding.   Anxious to go home. At this point, could discharge with close outpatient follow-up.    Plan: Follow-up on pending H.pylori serology PPI BID Heart healthy diet Avoid NSAIDs and ETOH Follow-up as outpatient with GI Keep outpatient follow-up with Oncology   Annitta Needs, PhD, ANP-BC [facility]     LOS: 3 days    01/07/2017, 8:03 AM

## 2017-01-08 ENCOUNTER — Encounter: Payer: Self-pay | Admitting: Gastroenterology

## 2017-01-08 LAB — H. PYLORI ANTIBODY, IGG

## 2017-01-08 NOTE — Telephone Encounter (Signed)
PATIENT SCHEDULED  °

## 2017-01-09 ENCOUNTER — Encounter (HOSPITAL_COMMUNITY): Payer: BLUE CROSS/BLUE SHIELD

## 2017-01-16 ENCOUNTER — Ambulatory Visit (HOSPITAL_COMMUNITY): Payer: BLUE CROSS/BLUE SHIELD

## 2017-01-16 ENCOUNTER — Telehealth (HOSPITAL_COMMUNITY): Payer: Self-pay | Admitting: Emergency Medicine

## 2017-01-16 NOTE — Telephone Encounter (Signed)
pts dad called and stated that Edward Graham was not coming in for chemo today because he was exhausted.  RN explained that he needed treatment because it has been 1 month since he has had treatment and we or them did not want the cancer to spread and this treatment to stop working. Edward Graham states that he had already told us that he did not have insurance after the 12th and he probably was not going to have it until january 1st.  We were not going to rack up a bill on him and make him go bankrupt.  RN explained that it was better for him to have no insurance than insurance that didnt pay much.  The cancer center could offer him more financial assistance this way.  Edward Graham states that we were winning a losing battel because he was not coming in.  Edward Craze NP notified and we will reschedule him for next week for chemo.  Amy our scheduler will call him with the appt.

## 2017-01-18 ENCOUNTER — Encounter (HOSPITAL_COMMUNITY): Payer: BLUE CROSS/BLUE SHIELD

## 2017-01-21 ENCOUNTER — Ambulatory Visit (HOSPITAL_COMMUNITY): Payer: BLUE CROSS/BLUE SHIELD

## 2017-01-23 ENCOUNTER — Ambulatory Visit (HOSPITAL_COMMUNITY): Payer: BLUE CROSS/BLUE SHIELD

## 2017-01-23 ENCOUNTER — Encounter (HOSPITAL_COMMUNITY): Payer: BLUE CROSS/BLUE SHIELD

## 2017-01-24 ENCOUNTER — Other Ambulatory Visit (HOSPITAL_COMMUNITY): Payer: Self-pay | Admitting: Oncology

## 2017-01-25 ENCOUNTER — Encounter (HOSPITAL_COMMUNITY): Payer: BLUE CROSS/BLUE SHIELD

## 2017-02-11 ENCOUNTER — Ambulatory Visit (HOSPITAL_COMMUNITY): Payer: BLUE CROSS/BLUE SHIELD | Admitting: Hematology and Oncology

## 2017-02-11 ENCOUNTER — Ambulatory Visit (HOSPITAL_COMMUNITY): Payer: BLUE CROSS/BLUE SHIELD

## 2017-02-13 ENCOUNTER — Encounter (HOSPITAL_COMMUNITY): Payer: BLUE CROSS/BLUE SHIELD

## 2017-02-23 ENCOUNTER — Other Ambulatory Visit: Payer: Self-pay | Admitting: Nurse Practitioner

## 2017-02-28 ENCOUNTER — Ambulatory Visit: Payer: BLUE CROSS/BLUE SHIELD | Admitting: Gastroenterology

## 2017-03-08 DEATH — deceased

## 2018-12-13 IMAGING — MR MR HEAD WO/W CM
10 of 13 series · 34 of 48 positions shown · IV contrast (multihance)
Comparison: None.

CLINICAL DATA: 55-year-old male with weakness and falls. Vertigo.
Colon cancer.

EXAM:
MRI HEAD WITHOUT AND WITH CONTRAST
TECHNIQUE: Multiplanar, multiecho pulse sequences of the brain and surrounding
structures were obtained without and with intravenous contrast.
CONTRAST:  16mL MULTIHANCE GADOBENATE DIMEGLUMINE 529 MG/ML IV SOLN

[Series 4: DWI · axial · 3.0mm · 0.82mm/px · z∈[-133,+13]mm · 5 of 50 slices shown (1 of 4)]
[im 1/50]
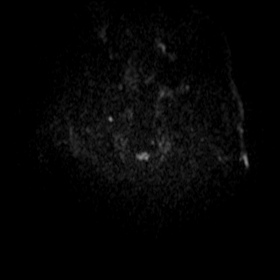
[im 13/50]
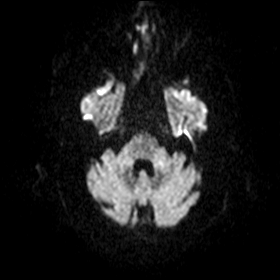
[im 25/50]
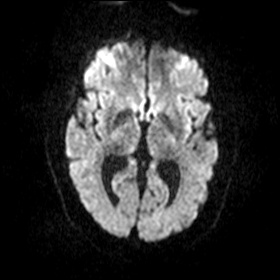
[im 37/50]
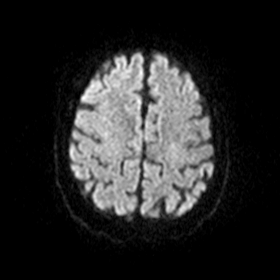
[im 50/50]
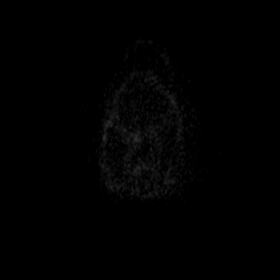

[Series 5: DWI · axial · 3.0mm · 0.82mm/px · z∈[-133,+13]mm · 4 of 50 slices shown (2 of 4)]
[im 1/50]
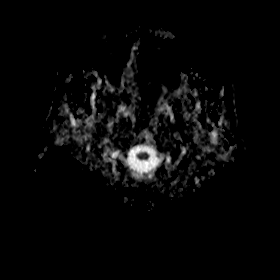
[im 17/50]
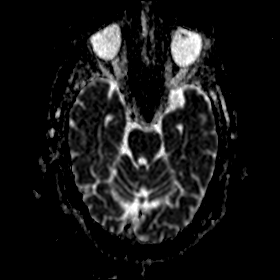
[im 33/50]
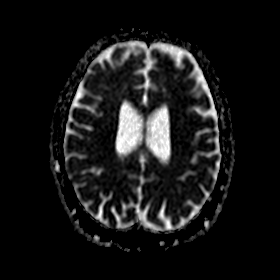
[im 50/50]
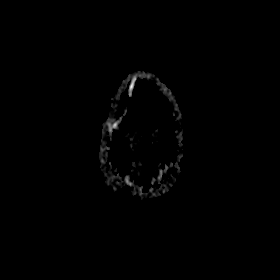

[Series 6: DWI · coronal · 5.0mm · 0.52mm/px · 3 of 34 slices shown (3 of 4)]
[im 1/34]
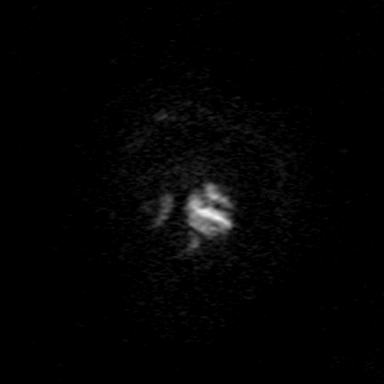
[im 17/34]
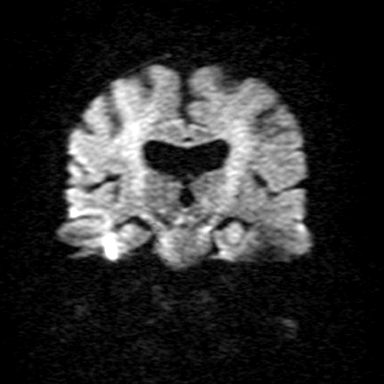
[im 34/34]
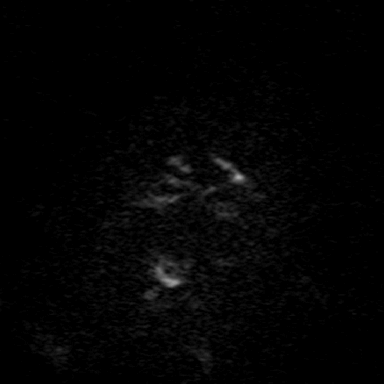

[Series 7: DWI · coronal · 5.0mm · 0.52mm/px · 3 of 34 slices shown (4 of 4)]
[im 1/34]
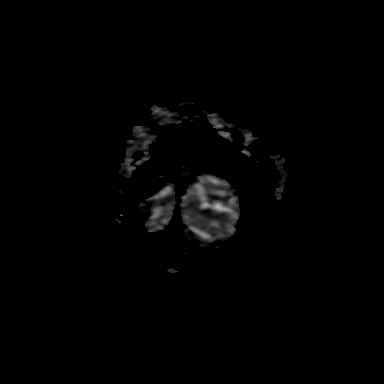
[im 17/34]
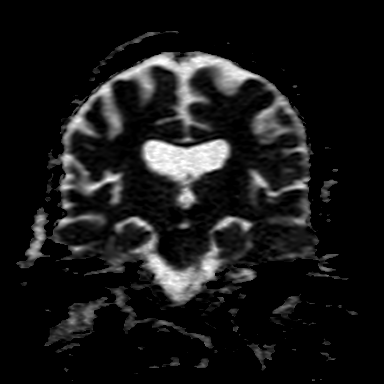
[im 34/34]
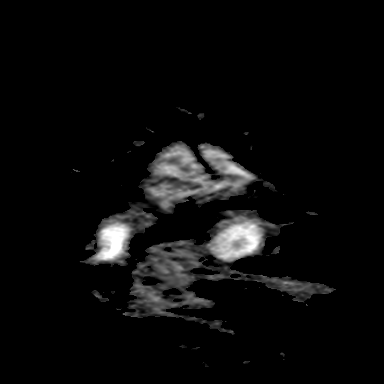

[Series 8: T2 · axial · 5.0mm · 0.60mm/px · z∈[-130,+12]mm · 2 of 23 slices shown (1 of 2)]
[im 1/23]
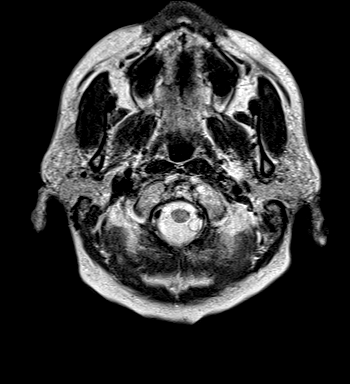
[im 23/23]
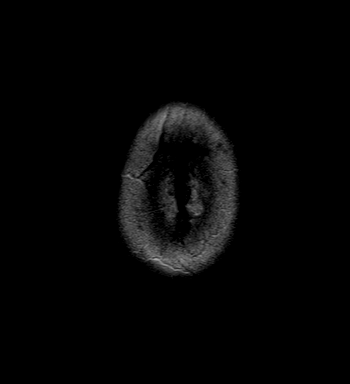

[Series 9: FLAIR · axial · 3.0mm · 0.94mm/px · z∈[-130,+8]mm · 4 of 47 slices shown]
[im 1/47]
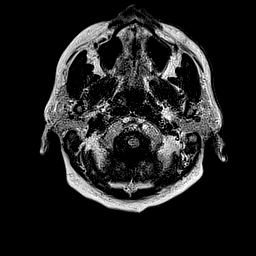
[im 16/47]
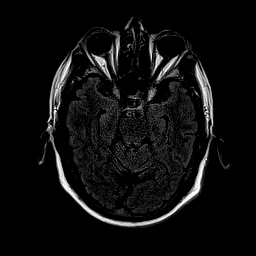
[im 31/47]
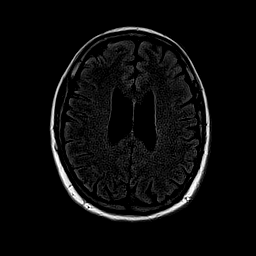
[im 47/47]
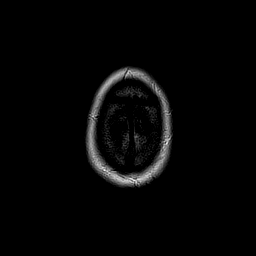

[Series 14: T2 · coronal · 5.0mm · 0.60mm/px · 1 of 28 slices shown (2 of 2)]
[im 1/28]
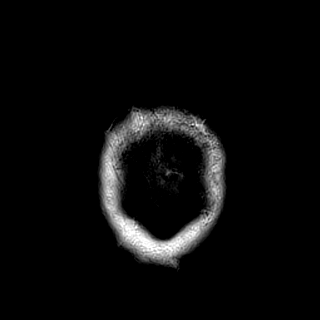

[Series 15: T1 post-contrast · axial · 2.0mm · 0.45mm/px · z∈[-143,+44]mm · 8 of 95 slices shown (1 of 3)]
[im 1/95]
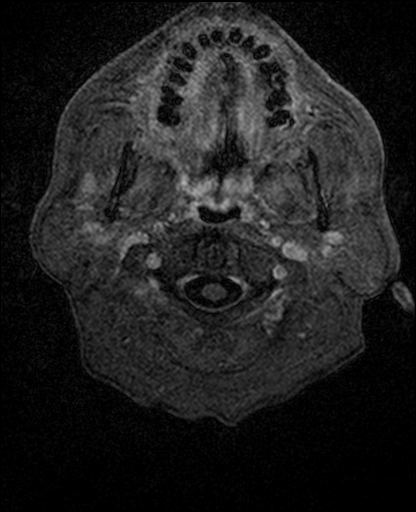
[im 14/95]
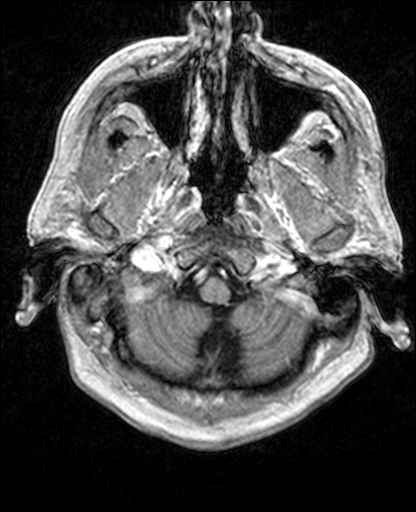
[im 27/95]
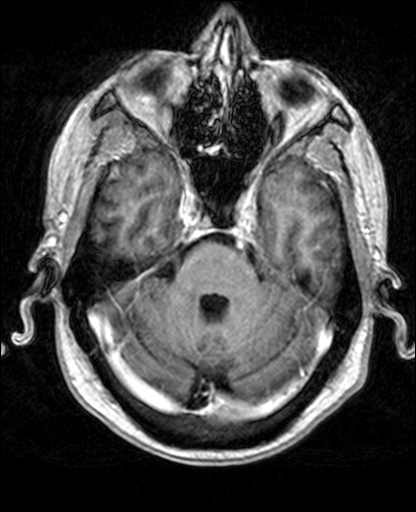
[im 41/95]
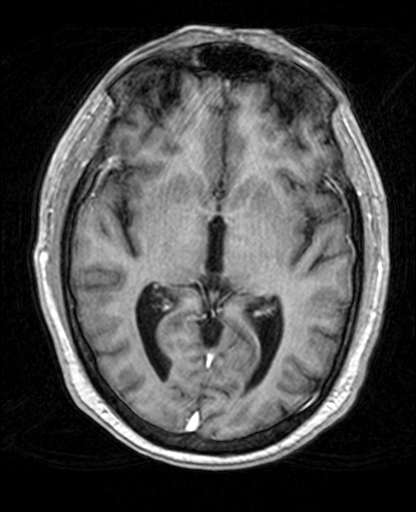
[im 54/95]
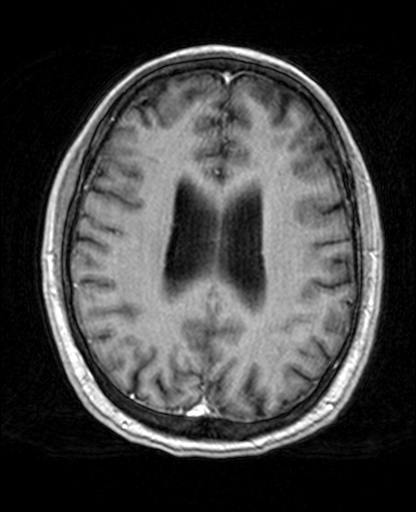
[im 68/95]
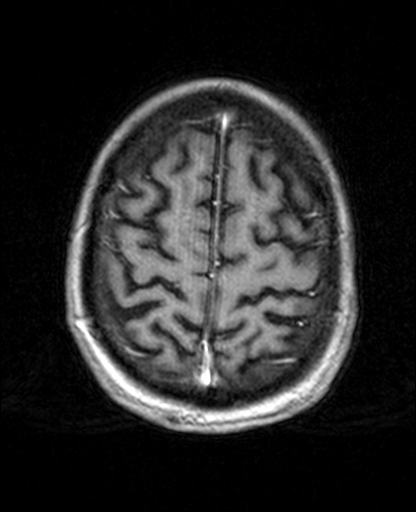
[im 81/95]
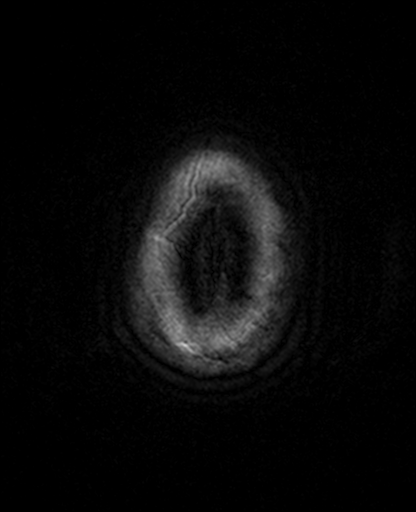
[im 95/95]
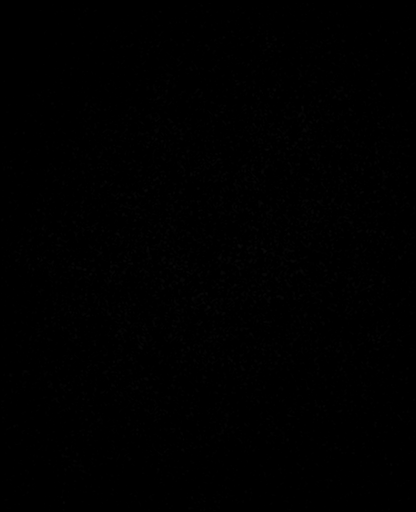

[Series 16: T1 post-contrast · coronal · 5.0mm · 0.40mm/px · 2 of 24 slices shown (2 of 3)]
[im 1/24]
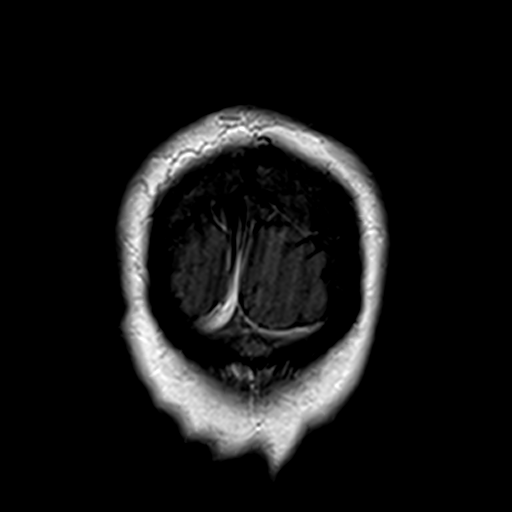
[im 24/24]
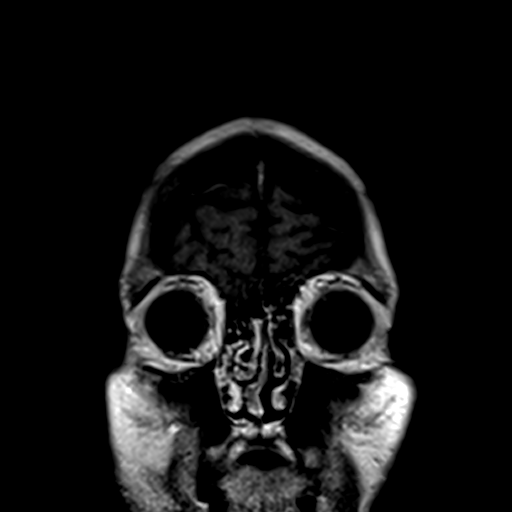

[Series 17: T1 post-contrast · sagittal · 5.0mm · 0.43mm/px · 2 of 21 slices shown (3 of 3)]
[im 1/21]
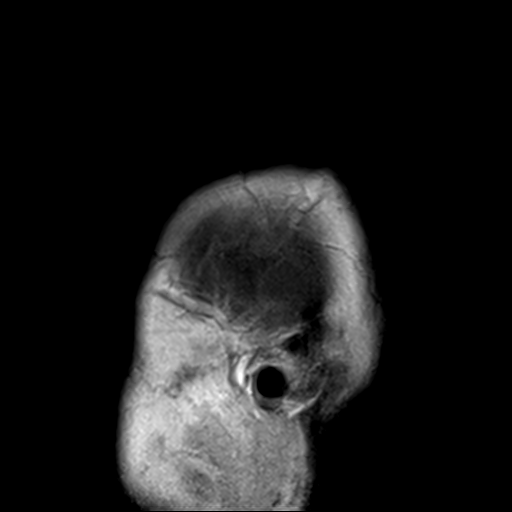
[im 21/21]
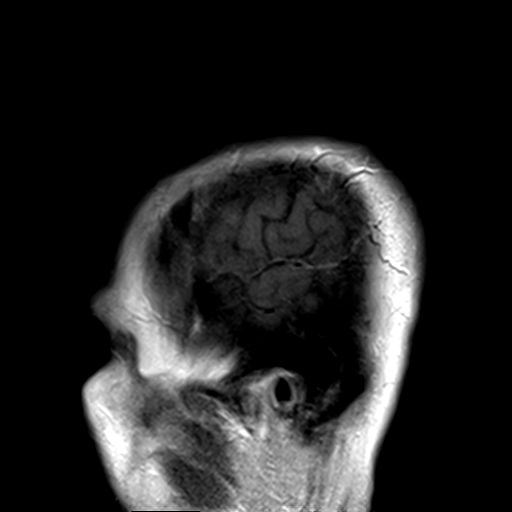

[34 of 48 positions shown; findings below may reference images not displayed]

FINDINGS: Brain: No restricted diffusion to suggest acute infarction. No
midline shift, mass effect, evidence of mass lesion,
ventriculomegaly, extra-axial collection or acute intracranial
hemorrhage. Cervicomedullary junction and pituitary are within
normal limits.

No abnormal enhancement identified. Small developmental venous
anomaly in the left occipital lobe (series 16, image 5, normal
variant) Gray and white matter signal is within normal limits
throughout the brain. No dural thickening.

Vascular: Major intracranial vascular flow voids are preserved.

Skull and upper cervical spine: Visualized bone marrow signal is
within normal limits. Negative visualized cervical spine and spinal
cord.

Sinuses/Orbits: Negative.

Other: Visible internal auditory structures appear normal. Negative
mastoid air cells. Negative scalp soft tissues.
IMPRESSION: No metastatic disease or acute intracranial abnormality. Negative
MRI appearance of the brain.
